# Patient Record
Sex: Female | Born: 1935 | Race: White | Hispanic: No | Smoking: Never smoker
Health system: Southern US, Community
[De-identification: ages and names within clinical notes are randomized; demographics above are authoritative.]

## PROBLEM LIST (undated history)

## (undated) DIAGNOSIS — G2581 Restless legs syndrome: Secondary | ICD-10-CM

## (undated) DIAGNOSIS — M797 Fibromyalgia: Secondary | ICD-10-CM

## (undated) DIAGNOSIS — G3184 Mild cognitive impairment, so stated: Secondary | ICD-10-CM

## (undated) DIAGNOSIS — E611 Iron deficiency: Secondary | ICD-10-CM

## (undated) DIAGNOSIS — I499 Cardiac arrhythmia, unspecified: Secondary | ICD-10-CM

## (undated) DIAGNOSIS — I10 Essential (primary) hypertension: Secondary | ICD-10-CM

## (undated) DIAGNOSIS — G459 Transient cerebral ischemic attack, unspecified: Secondary | ICD-10-CM

## (undated) DIAGNOSIS — G4700 Insomnia, unspecified: Secondary | ICD-10-CM

## (undated) DIAGNOSIS — H409 Unspecified glaucoma: Secondary | ICD-10-CM

## (undated) DIAGNOSIS — M5412 Radiculopathy, cervical region: Secondary | ICD-10-CM

## (undated) DIAGNOSIS — K219 Gastro-esophageal reflux disease without esophagitis: Secondary | ICD-10-CM

## (undated) DIAGNOSIS — M1990 Unspecified osteoarthritis, unspecified site: Secondary | ICD-10-CM

## (undated) DIAGNOSIS — K5900 Constipation, unspecified: Secondary | ICD-10-CM

## (undated) DIAGNOSIS — R2681 Unsteadiness on feet: Secondary | ICD-10-CM

## (undated) DIAGNOSIS — K589 Irritable bowel syndrome without diarrhea: Secondary | ICD-10-CM

## (undated) DIAGNOSIS — E785 Hyperlipidemia, unspecified: Secondary | ICD-10-CM

## (undated) DIAGNOSIS — E119 Type 2 diabetes mellitus without complications: Secondary | ICD-10-CM

## (undated) HISTORY — DX: Mild cognitive impairment of uncertain or unknown etiology: G31.84

## (undated) HISTORY — DX: Type 2 diabetes mellitus without complications: E11.9

## (undated) HISTORY — DX: Unspecified osteoarthritis, unspecified site: M19.90

## (undated) HISTORY — DX: Unsteadiness on feet: R26.81

## (undated) HISTORY — DX: Radiculopathy, cervical region: M54.12

## (undated) HISTORY — DX: Essential (primary) hypertension: I10

## (undated) HISTORY — DX: Fibromyalgia: M79.7

## (undated) HISTORY — DX: Gastro-esophageal reflux disease without esophagitis: K21.9

## (undated) HISTORY — PX: ABDOMINAL HYSTERECTOMY: SHX81

## (undated) HISTORY — DX: Hyperlipidemia, unspecified: E78.5

## (undated) HISTORY — DX: Restless legs syndrome: G25.81

## (undated) HISTORY — DX: Unspecified glaucoma: H40.9

## (undated) HISTORY — DX: Transient cerebral ischemic attack, unspecified: G45.9

## (undated) HISTORY — DX: Cardiac arrhythmia, unspecified: I49.9

## (undated) HISTORY — PX: TONSILLECTOMY: SUR1361

## (undated) HISTORY — DX: Iron deficiency: E61.1

## (undated) HISTORY — DX: Irritable bowel syndrome, unspecified: K58.9

## (undated) HISTORY — PX: SKIN CANCER EXCISION: SHX779

## (undated) HISTORY — DX: Insomnia, unspecified: G47.00

## (undated) HISTORY — DX: Constipation, unspecified: K59.00

---

## 2019-05-03 ENCOUNTER — Ambulatory Visit (INDEPENDENT_AMBULATORY_CARE_PROVIDER_SITE_OTHER): Payer: Medicare Other | Admitting: Cardiology

## 2019-05-03 ENCOUNTER — Other Ambulatory Visit: Payer: Self-pay

## 2019-05-03 ENCOUNTER — Encounter: Payer: Self-pay | Admitting: Cardiology

## 2019-05-03 VITALS — BP 147/76 | HR 88 | Ht 65.0 in | Wt 175.0 lb

## 2019-05-03 DIAGNOSIS — R0600 Dyspnea, unspecified: Secondary | ICD-10-CM | POA: Diagnosis not present

## 2019-05-03 DIAGNOSIS — R600 Localized edema: Secondary | ICD-10-CM | POA: Diagnosis not present

## 2019-05-03 DIAGNOSIS — R079 Chest pain, unspecified: Secondary | ICD-10-CM

## 2019-05-03 DIAGNOSIS — R0609 Other forms of dyspnea: Secondary | ICD-10-CM

## 2019-05-03 DIAGNOSIS — R42 Dizziness and giddiness: Secondary | ICD-10-CM

## 2019-05-03 MED ORDER — FUROSEMIDE 20 MG PO TABS: 10.0000 mg | ORAL_TABLET | Freq: Every day | ORAL | 3 refills | Status: DC

## 2019-05-03 NOTE — Progress Notes (Signed)
Cardiology Office Note:    Date:  05/03/2019   ID:  Leah Contreras, DOB Aug 25, 1931, MRN [id number]  PCP:  [name], FNP  Cardiologist:  Kate Sable, MD  Electrophysiologist:  None   Referring MD: [name], FNP   Chief Complaint  Patient presents with  . New Patient (Initial Visit)    Dizziness when standing, BL ankle edema, chest tightness, and DOE; Meds verbally reviewed with patient.    History of Present Illness:    Leah Contreras is a 86 y.o. female with a hx of hypertension, hyperlipidemia venting with shortness of breath and chest tightness.  Patient states having symptoms of shortness of breath for the past 6 months.  Symptoms have stayed about the same.  Symptoms usually occur with exertion.  She has a recent fall leading to right humerus fracture.  She tripped over a sidewalk causing the fall.  Currently in rehab and has noticed shortness of breath when she does her rehab.  She denies any history of heart disease.  Shortness of breath is sometimes associated with chest tightness which she rates as a 3 out of 10.  Chest discomfort lasts couple of minutes and goes away.  Symptoms do not occur at rest.  Over the past couple of months since starting rehab, she has noticed lower extremity edema which is sometimes worse towards the end of the day.  She denies palpitations, orthopnea, syncope.  She states having occasional lightheadedness when changing positions abruptly such as from sitting to standing, or bending to tie her shoelace.  Past Medical History:  Diagnosis Date  . Cervical radiculopathy   . Constipation   . Fibromyalgia   . Gait instability   . GERD (gastroesophageal reflux disease)   . Glaucoma   . Glaucoma   . Hyperlipidemia   . Hypertension   . IBS (irritable bowel syndrome)   . Insomnia   . Iron deficiency   . Irregular heart rhythm   . Mild cognitive impairment   . Osteoarthritis   . RLS (restless legs syndrome)   . TIA (transient ischemic  attack)   . Type 2 diabetes mellitus ([place])     Past Surgical History:  Procedure Laterality Date  . ABDOMINAL HYSTERECTOMY    . SKIN CANCER EXCISION    . TONSILLECTOMY      Current Medications: Current Meds  Medication Sig  . aspirin (ASPIRIN 81) 81 MG EC tablet Take 81 mg by mouth daily. Swallow whole.  . cetirizine (ZYRTEC) 10 MG tablet Take 10 mg by mouth daily.  . cyanocobalamin 2000 MCG tablet Take 2,000 mcg by mouth daily.  . Diclofenac Sodium 3 % GEL Apply topically as needed.   . docusate sodium (COLACE) 100 MG capsule Take 100 mg by mouth 2 (two) times daily.  . ferrous sulfate 325 (65 FE) MG tablet Take 325 mg by mouth daily with breakfast.  . lidocaine (LIDODERM) 5 % Place 1 patch onto the skin daily. Remove & Discard patch within 12 hours or as directed by MD  . lisinopril (ZESTRIL) 20 MG tablet Take 20 mg by mouth daily.  . metoprolol succinate (TOPROL-XL) 25 MG 24 hr tablet Take 25 mg by mouth daily.  . mirtazapine (REMERON) 15 MG tablet Take 15 mg by mouth at bedtime.  Marland Kitchen omeprazole (PRILOSEC) 20 MG capsule Take 20 mg by mouth daily.  . pregabalin (LYRICA) 75 MG capsule Take 75 mg by mouth 2 (two) times daily.  . rosuvastatin (CRESTOR) 10 MG tablet Take 10  mg by mouth daily.  . timolol (TIMOPTIC) 0.5 % ophthalmic solution 1 drop 2 (two) times daily.     Allergies:   Codeine sulfate [codeine], Erythromycin, and Tramadol   Social History   Socioeconomic History  . Marital status: Widowed    Spouse name: Not on file  . Number of children: Not on file  . Years of education: Not on file  . Highest education level: Not on file  Occupational History  . Not on file  Tobacco Use  . Smoking status: Never Smoker  . Smokeless tobacco: Never Used  Substance and Sexual Activity  . Alcohol use: Yes    Comment: rarely  . Drug use: Never  . Sexual activity: Not on file  Other Topics Concern  . Not on file  Social History Narrative  . Not on file   Social  Determinants of Health   Financial Resource Strain:   . Difficulty of Paying Living Expenses:   Food Insecurity:   . Worried About Programme researcher, broadcasting/film/video in the Last Year:   . Barista in the Last Year:   Transportation Needs:   . Freight forwarder (Medical):   Marland Kitchen Lack of Transportation (Non-Medical):   Physical Activity:   . Days of Exercise per Week:   . Minutes of Exercise per Session:   Stress:   . Feeling of Stress :   Social Connections:   . Frequency of Communication with Friends and Family:   . Frequency of Social Gatherings with Friends and Family:   . Attends Religious Services:   . Active Member of Clubs or Organizations:   . Attends Banker Meetings:   Marland Kitchen Marital Status:      Family History: The patient's family history includes Diabetes type II in her mother; Heart disease in her father and mother; Neurologic Disorder in her mother.  ROS:   Please see the history of present illness.     All other systems reviewed and are negative.  EKGs/Labs/Other Studies Reviewed:    The following studies were reviewed today:   EKG:  EKG is  ordered today.  The ekg ordered today demonstrates normal sinus rhythm, possible old inferior infarct.  Recent Labs: No results found for requested labs within last 8760 hours.  Recent Lipid Panel No results found for: CHOL, TRIG, HDL, CHOLHDL, VLDL, LDLCALC, LDLDIRECT  Physical Exam:    VS:  BP (!) 147/76 (BP Location: Right Arm, Patient Position: Sitting, Cuff Size: Normal)   Pulse 88   Ht 5\' 5"  (1.651 m)   Wt 175 lb (79.4 kg)   SpO2 92%   BMI 29.12 kg/m     Wt Readings from Last 3 Encounters:  05/03/19 175 lb (79.4 kg)     GEN:  Well nourished, well developed in no acute distress HEENT: Normal NECK: No JVD; No carotid bruits LYMPHATICS: No lymphadenopathy CARDIAC: RRR, no murmurs, rubs, gallops RESPIRATORY:  Clear to auscultation without rales, wheezing or rhonchi  ABDOMEN: Soft, non-tender,  non-distended MUSCULOSKELETAL:  1+ edema; No deformity  SKIN: Warm and dry NEUROLOGIC:  Alert and oriented x 3 PSYCHIATRIC:  Normal affect   ASSESSMENT:    1. Chest pain of uncertain etiology   2. Dyspnea on exertion   3. Edema of lower extremity   4. Dizziness    PLAN:    In order of problems listed above:  1. Patient with occasional exertional chest tightness.  She has risk factors of hypertension, hyperlipidemia.  Will evaluate for the presence of CAD with Lexiscan Myoview. 2. Patient has dyspnea on exertion and lower extremity edema.  Will get echocardiogram to evaluate any structural dysfunction/cardiomyopathy. 3. Lower extremity edema worse towards the end of the day.  This could be venous insufficiency or cardiomyopathy.  Start Lasix 20 mg daily.  Will evaluate with echocardiogram as above. 4. Patient with positional dizziness.  Orthostatic vitals in the office today did not reveal any evidence for orthostasis.  She likely has benign positional vertigo.  Recommend follow-up with PCP for further management.  Follow-up after echocardiogram, Myoview.  Total encounter time more than 65 minutes  Greater than 50% was spent in counseling and coordination of care with the patient. Time spent explaining to patient and daughter etiologies of dizziness, etiologies of edema, indications for stress testing.  All questions were answered.   This note was generated in part or whole with voice recognition software. Voice recognition is usually quite accurate but there are transcription errors that can and very often do occur. I apologize for any typographical errors that were not detected and corrected.  Medication Adjustments/Labs and Tests Ordered: Current medicines are reviewed at length with the patient today.  Concerns regarding medicines are outlined above.  No orders of the defined types were placed in this encounter.  No orders of the defined types were placed in this  encounter.   There are no Patient Instructions on file for this visit.   Signed, Debbe Odea, MD  05/03/2019 3:38 PM    [facility]

## 2019-05-03 NOTE — Patient Instructions (Addendum)
Medication Instructions:  - Your physician has recommended you make the following change in your medication:   1) Start lasix (furosemide) 20 mg- take 0.5 tablet (10 mg) by mouth once daily  *If you need a refill on your cardiac medications before your next appointment, please call your pharmacy*   Lab Work: - none ordered  If you have labs (blood work) drawn today and your tests are completely normal, you will receive your results only by: Marland Kitchen MyChart Message (if you have MyChart) OR . A paper copy in the mail If you have any lab test that is abnormal or we need to change your treatment, we will call you to review the results.   Testing/Procedures: - Your physician has requested that you have an echocardiogram. Echocardiography is a painless test that uses sound waves to create images of your heart. It provides your doctor with information about the size and shape of your heart and how well your heart's chambers and valves are working. This procedure takes approximately one hour. There are no restrictions for this procedure.  - Your physician has requested that you have a lexiscan myoview.    [facility] MYOVIEW  Your caregiver has ordered a Stress Test with nuclear imaging. The purpose of this test is to evaluate the blood supply to your heart muscle. This procedure is referred to as a "Non-Invasive Stress Test." This is because other than having an IV started in your vein, nothing is inserted or "invades" your body. Cardiac stress tests are done to find areas of poor blood flow to the heart by determining the extent of coronary artery disease (CAD). Some patients exercise on a treadmill, which naturally increases the blood flow to your heart, while others who are  unable to walk on a treadmill due to physical limitations have a pharmacologic/chemical stress agent called Lexiscan . This medicine will mimic walking on a treadmill by temporarily increasing your coronary blood flow.   Please note:  these test may take anywhere between 2-4 hours to complete  PLEASE REPORT TO [facility] MEDICAL MALL ENTRANCE  THE VOLUNTEERS AT THE FIRST DESK WILL DIRECT YOU WHERE TO GO  Date of Procedure:_____________________________________  Arrival Time for Procedure:______________________________  Instructions regarding medication:   _x___:  Hold betablocker(s) night before procedure and morning of procedure (metoprolol)   __x__:  Hold lasix (furosemide) the morning of your test  PLEASE NOTIFY THE OFFICE AT LEAST 24 HOURS IN ADVANCE IF YOU ARE UNABLE TO KEEP YOUR APPOINTMENT.  [phone] AND  PLEASE NOTIFY NUCLEAR MEDICINE AT [facility] AT LEAST 24 HOURS IN ADVANCE IF YOU ARE UNABLE TO KEEP YOUR APPOINTMENT. [phone]  How to prepare for your Myoview test:  1. Do not eat or drink after midnight 2. No caffeine for 24 hours prior to test 3. No smoking 24 hours prior to test. 4. Any medications not listed above may be taken with water the morning of your test 5. Ladies, please do not wear dresses.  Skirts or pants are appropriate. Please wear a short sleeve shirt. 6. No perfume, cologne or lotion. 7. Wear comfortable walking shoes. No heels!     Follow-Up: At [facility], you and your health needs are our priority.  As part of our continuing mission to provide you with exceptional heart care, we have created designated Provider Care Teams.  These Care Teams include your primary Cardiologist (physician) and Advanced Practice Providers (APPs -  Physician Assistants and Nurse Practitioners) who all work together to provide you with  the care you need, when you need it.  We recommend signing up for the patient portal called "MyChart".  Sign up information is provided on this After Visit Summary.  MyChart is used to connect with patients for Virtual Visits (Telemedicine).  Patients are able to view lab/test results, encounter notes, upcoming appointments, etc.  Non-urgent messages can be sent to your  provider as well.   To learn more about what you can do with MyChart, go to [email or web address].    Your next appointment:   After testing is completed   The format for your next appointment:   In Person  Provider:   Debbe Odea, MD   Other Instructions n/a   Echocardiogram An echocardiogram is a procedure that uses painless sound waves (ultrasound) to produce an image of the heart. Images from an echocardiogram can provide important information about:  Signs of coronary artery disease (CAD).  Aneurysm detection. An aneurysm is a weak or damaged part of an artery wall that bulges out from the normal force of blood pumping through the body.  Heart size and shape. Changes in the size or shape of the heart can be associated with certain conditions, including heart failure, aneurysm, and CAD.  Heart muscle function.  Heart valve function.  Signs of a past heart attack.  Fluid buildup around the heart.  Thickening of the heart muscle.  A tumor or infectious growth around the heart valves. Tell a health care provider about:  Any allergies you have.  All medicines you are taking, including vitamins, herbs, eye drops, creams, and over-the-counter medicines.  Any blood disorders you have.  Any surgeries you have had.  Any medical conditions you have.  Whether you are pregnant or may be pregnant. What are the risks? Generally, this is a safe procedure. However, problems may occur, including:  Allergic reaction to dye (contrast) that may be used during the procedure. What happens before the procedure? No specific preparation is needed. You may eat and drink normally. What happens during the procedure?   An IV tube may be inserted into one of your veins.  You may receive contrast through this tube. A contrast is an injection that improves the quality of the pictures from your heart.  A gel will be applied to your chest.  A wand-like tool (transducer)  will be moved over your chest. The gel will help to transmit the sound waves from the transducer.  The sound waves will harmlessly bounce off of your heart to allow the heart images to be captured in real-time motion. The images will be recorded on a computer. The procedure may vary among health care providers and hospitals. What happens after the procedure?  You may return to your normal, everyday life, including diet, activities, and medicines, unless your health care provider tells you not to do that. Summary  An echocardiogram is a procedure that uses painless sound waves (ultrasound) to produce an image of the heart.  Images from an echocardiogram can provide important information about the size and shape of your heart, heart muscle function, heart valve function, and fluid buildup around your heart.  You do not need to do anything to prepare before this procedure. You may eat and drink normally.  After the echocardiogram is completed, you may return to your normal, everyday life, unless your health care provider tells you not to do that. This information is not intended to replace advice given to you by your health care provider. Make sure you discuss  any questions you have with your health care provider. Document Revised: 04/12/2018 Document Reviewed: 01/23/2016 Elsevier Patient Education  2020 Elsevier Inc.    Cardiac Nuclear Scan A cardiac nuclear scan is a test that measures blood flow to the heart when a person is resting and when he or she is exercising. The test looks for problems such as:  Not enough blood reaching a portion of the heart.  The heart muscle not working normally. You may need this test if:  You have heart disease.  You have had abnormal lab results.  You have had heart surgery or a balloon procedure to open up blocked arteries (angioplasty).  You have chest pain.  You have shortness of breath. In this test, a radioactive dye (tracer) is injected  into your bloodstream. After the tracer has traveled to your heart, an imaging device is used to measure how much of the tracer is absorbed by or distributed to various areas of your heart. This procedure is usually done at a hospital and takes 2-4 hours. Tell a health care provider about:  Any allergies you have.  All medicines you are taking, including vitamins, herbs, eye drops, creams, and over-the-counter medicines.  Any problems you or family members have had with anesthetic medicines.  Any blood disorders you have.  Any surgeries you have had.  Any medical conditions you have.  Whether you are pregnant or may be pregnant. What are the risks? Generally, this is a safe procedure. However, problems may occur, including:  Serious chest pain and heart attack. This is only a risk if the stress portion of the test is done.  Rapid heartbeat.  Sensation of warmth in your chest. This usually passes quickly.  Allergic reaction to the tracer. What happens before the procedure?  Ask your health care provider about changing or stopping your regular medicines. This is especially important if you are taking diabetes medicines or blood thinners.  Follow instructions from your health care provider about eating or drinking restrictions.  Remove your jewelry on the day of the procedure. What happens during the procedure?  An IV will be inserted into one of your veins.  Your health care provider will inject a small amount of radioactive tracer through the IV.  You will wait for 20-40 minutes while the tracer travels through your bloodstream.  Your heart activity will be monitored with an electrocardiogram (ECG).  You will lie down on an exam table.  Images of your heart will be taken for about 15-20 minutes.  You may also have a stress test. For this test, one of the following may be done: ? You will exercise on a treadmill or stationary bike. While you exercise, your heart's  activity will be monitored with an ECG, and your blood pressure will be checked. ? You will be given medicines that will increase blood flow to parts of your heart. This is done if you are unable to exercise.  When blood flow to your heart has peaked, a tracer will again be injected through the IV.  After 20-40 minutes, you will get back on the exam table and have more images taken of your heart.  Depending on the type of tracer used, scans may need to be repeated 3-4 hours later.  Your IV line will be removed when the procedure is over. The procedure may vary among health care providers and hospitals. What happens after the procedure?  Unless your health care provider tells you otherwise, you may return to  your normal schedule, including diet, activities, and medicines.  Unless your health care provider tells you otherwise, you may increase your fluid intake. This will help to flush the contrast dye from your body. Drink enough fluid to keep your urine pale yellow.  Ask your health care provider, or the department that is doing the test: ? When will my results be ready? ? How will I get my results? Summary  A cardiac nuclear scan measures the blood flow to the heart when a person is resting and when he or she is exercising.  Tell your health care provider if you are pregnant.  Before the procedure, ask your health care provider about changing or stopping your regular medicines. This is especially important if you are taking diabetes medicines or blood thinners.  After the procedure, unless your health care provider tells you otherwise, increase your fluid intake. This will help flush the contrast dye from your body.  After the procedure, unless your health care provider tells you otherwise, you may return to your normal schedule, including diet, activities, and medicines. This information is not intended to replace advice given to you by your health care provider. Make sure you  discuss any questions you have with your health care provider. Document Revised: 06/05/2017 Document Reviewed: 06/05/2017 Elsevier Patient Education  [place].

## 2019-05-22 ENCOUNTER — Other Ambulatory Visit: Payer: Self-pay

## 2019-05-22 ENCOUNTER — Ambulatory Visit
Admission: RE | Admit: 2019-05-22 | Discharge: 2019-05-22 | Disposition: A | Discharge status: Home / Self Care | Payer: Medicare Other | Source: Ambulatory Visit | Attending: Cardiology | Admitting: Cardiology

## 2019-05-22 DIAGNOSIS — R079 Chest pain, unspecified: Secondary | ICD-10-CM | POA: Diagnosis present

## 2019-05-22 LAB — NM MYOCAR MULTI W/SPECT W/WALL MOTION / EF
Estimated workload: 1 METS
Exercise duration (min): 0 min
Exercise duration (sec): 0 s
LV dias vol: 54 mL (ref 46–106)
LV sys vol: 16 mL
MPHR: 134 {beats}/min
Peak HR: 93 {beats}/min
Percent HR: 69 %
Rest HR: 74 {beats}/min
SDS: 2
SRS: 5
SSS: 4
TID: 0.97

## 2019-05-22 MED ORDER — REGADENOSON 0.4 MG/5ML IV SOLN
0.4000 mg | Freq: Once | INTRAVENOUS | Status: AC
  Administered 2019-05-22: 0.4 mg via INTRAVENOUS
  Filled 2019-05-22: qty 5

## 2019-05-22 MED ORDER — TECHNETIUM TC 99M TETROFOSMIN IV KIT
31.5300 | PACK | Freq: Once | INTRAVENOUS | Status: AC | PRN
  Administered 2019-05-22: 31.53 via INTRAVENOUS

## 2019-05-22 MED ORDER — TECHNETIUM TC 99M TETROFOSMIN IV KIT
10.0000 | PACK | Freq: Once | INTRAVENOUS | Status: AC | PRN
  Administered 2019-05-22: 10.9 via INTRAVENOUS

## 2019-05-23 ENCOUNTER — Telehealth: Payer: Self-pay

## 2019-05-23 NOTE — Telephone Encounter (Signed)
Called to give the patient stress test results and Dr. Merita Norton recommendation. Unable to lmom. Patients phone rings out and then gives a busy signal.

## 2019-05-23 NOTE — Telephone Encounter (Signed)
-----   Message from Brian Agbor-Etang, MD sent at 05/22/2019  4:39 PM EDT ----- Low risk scan, very small defect in the anterior wall.  LV wall motion is normal.  Keep follow-up appointment. 

## 2019-05-28 ENCOUNTER — Encounter: Payer: Self-pay | Admitting: *Deleted

## 2019-05-28 ENCOUNTER — Other Ambulatory Visit: Payer: Self-pay

## 2019-05-28 ENCOUNTER — Inpatient Hospital Stay
Admission: EM | Admit: 2019-05-28 | Discharge: 2019-06-02 | DRG: 184 | Disposition: A | Discharge status: Skilled Nursing Facility | Payer: Medicare Other | Attending: Internal Medicine | Admitting: Internal Medicine

## 2019-05-28 ENCOUNTER — Emergency Department: Payer: Medicare Other

## 2019-05-28 DIAGNOSIS — S42109A Fracture of unspecified part of scapula, unspecified shoulder, initial encounter for closed fracture: Secondary | ICD-10-CM | POA: Diagnosis present

## 2019-05-28 DIAGNOSIS — S2242XA Multiple fractures of ribs, left side, initial encounter for closed fracture: Secondary | ICD-10-CM | POA: Diagnosis not present

## 2019-05-28 DIAGNOSIS — E785 Hyperlipidemia, unspecified: Secondary | ICD-10-CM | POA: Diagnosis present

## 2019-05-28 DIAGNOSIS — S32010A Wedge compression fracture of first lumbar vertebra, initial encounter for closed fracture: Secondary | ICD-10-CM | POA: Diagnosis present

## 2019-05-28 DIAGNOSIS — S42192A Fracture of other part of scapula, left shoulder, initial encounter for closed fracture: Secondary | ICD-10-CM

## 2019-05-28 DIAGNOSIS — I10 Essential (primary) hypertension: Secondary | ICD-10-CM | POA: Diagnosis not present

## 2019-05-28 DIAGNOSIS — S2242XD Multiple fractures of ribs, left side, subsequent encounter for fracture with routine healing: Secondary | ICD-10-CM

## 2019-05-28 DIAGNOSIS — Z90710 Acquired absence of both cervix and uterus: Secondary | ICD-10-CM

## 2019-05-28 DIAGNOSIS — T07XXXA Unspecified multiple injuries, initial encounter: Secondary | ICD-10-CM | POA: Diagnosis present

## 2019-05-28 DIAGNOSIS — G2581 Restless legs syndrome: Secondary | ICD-10-CM | POA: Diagnosis present

## 2019-05-28 DIAGNOSIS — D649 Anemia, unspecified: Secondary | ICD-10-CM | POA: Diagnosis present

## 2019-05-28 DIAGNOSIS — R339 Retention of urine, unspecified: Secondary | ICD-10-CM | POA: Diagnosis present

## 2019-05-28 DIAGNOSIS — D72829 Elevated white blood cell count, unspecified: Secondary | ICD-10-CM | POA: Diagnosis present

## 2019-05-28 DIAGNOSIS — Z85828 Personal history of other malignant neoplasm of skin: Secondary | ICD-10-CM

## 2019-05-28 DIAGNOSIS — W19XXXA Unspecified fall, initial encounter: Secondary | ICD-10-CM | POA: Diagnosis not present

## 2019-05-28 DIAGNOSIS — G3184 Mild cognitive impairment, so stated: Secondary | ICD-10-CM | POA: Diagnosis present

## 2019-05-28 DIAGNOSIS — H409 Unspecified glaucoma: Secondary | ICD-10-CM | POA: Diagnosis present

## 2019-05-28 DIAGNOSIS — G4700 Insomnia, unspecified: Secondary | ICD-10-CM | POA: Diagnosis present

## 2019-05-28 DIAGNOSIS — R338 Other retention of urine: Secondary | ICD-10-CM | POA: Diagnosis not present

## 2019-05-28 DIAGNOSIS — Z8673 Personal history of transient ischemic attack (TIA), and cerebral infarction without residual deficits: Secondary | ICD-10-CM

## 2019-05-28 DIAGNOSIS — S32019A Unspecified fracture of first lumbar vertebra, initial encounter for closed fracture: Secondary | ICD-10-CM | POA: Diagnosis present

## 2019-05-28 DIAGNOSIS — Z7982 Long term (current) use of aspirin: Secondary | ICD-10-CM

## 2019-05-28 DIAGNOSIS — Z79899 Other long term (current) drug therapy: Secondary | ICD-10-CM

## 2019-05-28 DIAGNOSIS — Z885 Allergy status to narcotic agent status: Secondary | ICD-10-CM

## 2019-05-28 DIAGNOSIS — Z881 Allergy status to other antibiotic agents status: Secondary | ICD-10-CM

## 2019-05-28 DIAGNOSIS — S42112A Displaced fracture of body of scapula, left shoulder, initial encounter for closed fracture: Secondary | ICD-10-CM | POA: Diagnosis present

## 2019-05-28 DIAGNOSIS — M797 Fibromyalgia: Secondary | ICD-10-CM | POA: Diagnosis present

## 2019-05-28 DIAGNOSIS — M79602 Pain in left arm: Secondary | ICD-10-CM | POA: Diagnosis not present

## 2019-05-28 DIAGNOSIS — K219 Gastro-esophageal reflux disease without esophagitis: Secondary | ICD-10-CM | POA: Diagnosis present

## 2019-05-28 DIAGNOSIS — I951 Orthostatic hypotension: Secondary | ICD-10-CM | POA: Diagnosis not present

## 2019-05-28 DIAGNOSIS — Z8249 Family history of ischemic heart disease and other diseases of the circulatory system: Secondary | ICD-10-CM

## 2019-05-28 DIAGNOSIS — Z20822 Contact with and (suspected) exposure to covid-19: Secondary | ICD-10-CM | POA: Diagnosis present

## 2019-05-28 DIAGNOSIS — Z833 Family history of diabetes mellitus: Secondary | ICD-10-CM

## 2019-05-28 DIAGNOSIS — S2241XD Multiple fractures of ribs, right side, subsequent encounter for fracture with routine healing: Secondary | ICD-10-CM

## 2019-05-28 DIAGNOSIS — K589 Irritable bowel syndrome without diarrhea: Secondary | ICD-10-CM | POA: Diagnosis present

## 2019-05-28 DIAGNOSIS — S0003XA Contusion of scalp, initial encounter: Secondary | ICD-10-CM | POA: Diagnosis present

## 2019-05-28 DIAGNOSIS — E119 Type 2 diabetes mellitus without complications: Secondary | ICD-10-CM | POA: Diagnosis present

## 2019-05-28 DIAGNOSIS — W010XXA Fall on same level from slipping, tripping and stumbling without subsequent striking against object, initial encounter: Secondary | ICD-10-CM | POA: Diagnosis present

## 2019-05-28 DIAGNOSIS — E611 Iron deficiency: Secondary | ICD-10-CM | POA: Diagnosis present

## 2019-05-28 DIAGNOSIS — Y92811 Bus as the place of occurrence of the external cause: Secondary | ICD-10-CM

## 2019-05-28 DIAGNOSIS — K5900 Constipation, unspecified: Secondary | ICD-10-CM | POA: Diagnosis present

## 2019-05-28 LAB — URINALYSIS, ROUTINE W REFLEX MICROSCOPIC
Bilirubin Urine: NEGATIVE
Glucose, UA: NEGATIVE mg/dL
Hgb urine dipstick: NEGATIVE
Ketones, ur: NEGATIVE mg/dL
Leukocytes,Ua: NEGATIVE
Nitrite: NEGATIVE
Protein, ur: NEGATIVE mg/dL
Specific Gravity, Urine: 1.01 (ref 1.005–1.030)
pH: 7 (ref 5.0–8.0)

## 2019-05-28 LAB — BASIC METABOLIC PANEL
Anion gap: 10 (ref 5–15)
BUN: 20 mg/dL (ref 8–23)
CO2: 22 mmol/L (ref 22–32)
Calcium: 10 mg/dL (ref 8.9–10.3)
Chloride: 108 mmol/L (ref 98–111)
Creatinine, Ser: 1.05 mg/dL — ABNORMAL HIGH (ref 0.44–1.00)
GFR calc Af Amer: 56 mL/min — ABNORMAL LOW (ref >60)
GFR calc non Af Amer: 48 mL/min — ABNORMAL LOW (ref >60)
Glucose, Bld: 121 mg/dL — ABNORMAL HIGH (ref 70–99)
Potassium: 4.4 mmol/L (ref 3.5–5.1)
Sodium: 140 mmol/L (ref 135–145)

## 2019-05-28 LAB — CBC
HCT: 35.3 % — ABNORMAL LOW (ref 36.0–46.0)
Hemoglobin: 11.6 g/dL — ABNORMAL LOW (ref 12.0–15.0)
MCH: 29.4 pg (ref 26.0–34.0)
MCHC: 32.9 g/dL (ref 30.0–36.0)
MCV: 89.4 fL (ref 80.0–100.0)
Platelets: 127 10*3/uL — ABNORMAL LOW (ref 150–400)
RBC: 3.95 MIL/uL (ref 3.87–5.11)
RDW: 14.6 % (ref 11.5–15.5)
WBC: 12.8 10*3/uL — ABNORMAL HIGH (ref 4.0–10.5)
nRBC: 0 % (ref 0.0–0.2)

## 2019-05-28 LAB — SARS CORONAVIRUS 2 BY RT PCR (HOSPITAL ORDER, PERFORMED IN ~~LOC~~ HOSPITAL LAB): SARS Coronavirus 2: NEGATIVE

## 2019-05-28 MED ORDER — ACETAMINOPHEN 325 MG PO TABS: 650.0000 mg | ORAL_TABLET | Freq: Four times a day (QID) | ORAL | Status: DC | PRN

## 2019-05-28 MED ORDER — ONDANSETRON HCL 4 MG/2ML IJ SOLN
4.0000 mg | Freq: Once | INTRAMUSCULAR | Status: AC
  Administered 2019-05-28: 4 mg via INTRAVENOUS
  Filled 2019-05-28: qty 2

## 2019-05-28 MED ORDER — ROSUVASTATIN CALCIUM 10 MG PO TABS
10.0000 mg | ORAL_TABLET | Freq: Every day | ORAL | Status: DC
  Administered 2019-05-29 – 2019-06-01 (×4): 10 mg via ORAL
  Filled 2019-05-28 (×5): qty 1

## 2019-05-28 MED ORDER — OXYCODONE-ACETAMINOPHEN 5-325 MG PO TABS
1.0000 | ORAL_TABLET | Freq: Four times a day (QID) | ORAL | Status: DC | PRN
  Administered 2019-05-28 – 2019-05-30 (×2): 1 via ORAL
  Administered 2019-05-31: 2 via ORAL
  Administered 2019-06-02: 1 via ORAL
  Filled 2019-05-28: qty 2
  Filled 2019-05-28 (×3): qty 1

## 2019-05-28 MED ORDER — MIRTAZAPINE 15 MG PO TABS
15.0000 mg | ORAL_TABLET | Freq: Every day | ORAL | Status: DC
  Administered 2019-05-29 – 2019-06-01 (×4): 15 mg via ORAL
  Filled 2019-05-28 (×4): qty 1

## 2019-05-28 MED ORDER — FUROSEMIDE 20 MG PO TABS
10.0000 mg | ORAL_TABLET | Freq: Every day | ORAL | Status: DC
  Administered 2019-05-29 – 2019-05-30 (×2): 10 mg via ORAL
  Filled 2019-05-28: qty 1
  Filled 2019-05-28 (×2): qty 0.5

## 2019-05-28 MED ORDER — FENTANYL CITRATE (PF) 100 MCG/2ML IJ SOLN
12.5000 ug | INTRAMUSCULAR | Status: DC | PRN
  Administered 2019-05-29: 12.5 ug via INTRAVENOUS
  Filled 2019-05-28: qty 2

## 2019-05-28 MED ORDER — METOPROLOL SUCCINATE ER 25 MG PO TB24
25.0000 mg | ORAL_TABLET | Freq: Every day | ORAL | Status: DC
  Administered 2019-05-29 – 2019-06-01 (×4): 25 mg via ORAL
  Filled 2019-05-28 (×4): qty 1

## 2019-05-28 MED ORDER — LORATADINE 10 MG PO TABS
10.0000 mg | ORAL_TABLET | Freq: Every day | ORAL | Status: DC
  Administered 2019-05-29 – 2019-06-01 (×4): 10 mg via ORAL
  Filled 2019-05-28 (×4): qty 1

## 2019-05-28 MED ORDER — LISINOPRIL 20 MG PO TABS
20.0000 mg | ORAL_TABLET | Freq: Every day | ORAL | Status: DC
  Administered 2019-05-29 – 2019-05-30 (×2): 20 mg via ORAL
  Filled 2019-05-28: qty 2
  Filled 2019-05-28: qty 1

## 2019-05-28 MED ORDER — DOCUSATE SODIUM 100 MG PO CAPS
100.0000 mg | ORAL_CAPSULE | Freq: Two times a day (BID) | ORAL | Status: DC
  Administered 2019-05-29 – 2019-06-01 (×8): 100 mg via ORAL
  Filled 2019-05-28 (×8): qty 1

## 2019-05-28 MED ORDER — ASPIRIN EC 81 MG PO TBEC
81.0000 mg | DELAYED_RELEASE_TABLET | Freq: Every day | ORAL | Status: DC
  Administered 2019-05-29 – 2019-06-01 (×4): 81 mg via ORAL
  Filled 2019-05-28 (×4): qty 1

## 2019-05-28 MED ORDER — VITAMIN B-12 1000 MCG PO TABS
2000.0000 ug | ORAL_TABLET | Freq: Every day | ORAL | Status: DC
  Administered 2019-05-29 – 2019-06-01 (×4): 2000 ug via ORAL
  Filled 2019-05-28 (×5): qty 2

## 2019-05-28 MED ORDER — SENNA 8.6 MG PO TABS: 2.0000 | ORAL_TABLET | Freq: Every evening | ORAL | Status: DC | PRN

## 2019-05-28 MED ORDER — METHOCARBAMOL 500 MG PO TABS
500.0000 mg | ORAL_TABLET | Freq: Every day | ORAL | Status: DC
  Administered 2019-05-29 – 2019-06-01 (×4): 500 mg via ORAL
  Filled 2019-05-28 (×5): qty 1

## 2019-05-28 MED ORDER — FENTANYL CITRATE (PF) 100 MCG/2ML IJ SOLN
50.0000 ug | Freq: Once | INTRAMUSCULAR | Status: AC
  Administered 2019-05-28: 50 ug via INTRAVENOUS
  Filled 2019-05-28: qty 2

## 2019-05-28 MED ORDER — PREGABALIN 75 MG PO CAPS
75.0000 mg | ORAL_CAPSULE | Freq: Two times a day (BID) | ORAL | Status: DC
  Administered 2019-05-29 – 2019-06-01 (×8): 75 mg via ORAL
  Filled 2019-05-28 (×8): qty 1

## 2019-05-28 MED ORDER — PANTOPRAZOLE SODIUM 40 MG PO TBEC
40.0000 mg | DELAYED_RELEASE_TABLET | Freq: Every day | ORAL | Status: DC
  Administered 2019-05-29 – 2019-06-01 (×4): 40 mg via ORAL
  Filled 2019-05-28 (×4): qty 1

## 2019-05-28 MED ORDER — FERROUS SULFATE 325 (65 FE) MG PO TABS
325.0000 mg | ORAL_TABLET | Freq: Every day | ORAL | Status: DC
  Administered 2019-05-29 – 2019-06-01 (×4): 325 mg via ORAL
  Filled 2019-05-28 (×5): qty 1

## 2019-05-28 NOTE — ED Notes (Signed)
See triage note    States she fell getting into the bus at [facility]  Having pain to back and left shoulder  Good pulses to left   Increase dapin with movement

## 2019-05-28 NOTE — ED Triage Notes (Signed)
Pt brought in via ems from [facility] with a fall.  Pt fell getting into the bus.  Pt fell backwards landing on the curb.  Pt has back pain and left shoulder pain.   Limited rom left arm. No loc  No vomiting.  Hematoma to left side of head.  Abrasion noted with bleeding. No blood thinners.  No etoh use.  Pt alert  Speech clear.

## 2019-05-28 NOTE — ED Provider Notes (Signed)
[facility] Emergency Department Provider Note  Time seen: 6:43 PM  I have reviewed the triage vital signs and the nursing notes.   HISTORY  Chief Complaint Fall   HPI Leah Contreras is a 86 y.o. female with a past medical history of fibromyalgia, gastric reflux, hypertension, hyperlipidemia, anemia, diabetes, presents to the emergency department after a fall.  According to the patient she was attempting to get off a bus when she fell backwards landing on the sidewalk.  Patient states she hit her left side on the cement and the left side of her head.  Patient denies LOC although states she felt like she could pass out due to the pain in her upper back on the left side.  Currently describes moderate pain becomes severe with any type of movement, dull aching type pain.  Also states some lower back pain as well.  Denies extremity pain besides left shoulder discomfort.   Past Medical History:  Diagnosis Date  . Cervical radiculopathy   . Constipation   . Fibromyalgia   . Gait instability   . GERD (gastroesophageal reflux disease)   . Glaucoma   . Glaucoma   . Hyperlipidemia   . Hypertension   . IBS (irritable bowel syndrome)   . Insomnia   . Iron deficiency   . Irregular heart rhythm   . Mild cognitive impairment   . Osteoarthritis   . RLS (restless legs syndrome)   . TIA (transient ischemic attack)   . Type 2 diabetes mellitus (HCC)     There are no problems to display for this patient.   Past Surgical History:  Procedure Laterality Date  . ABDOMINAL HYSTERECTOMY    . SKIN CANCER EXCISION    . TONSILLECTOMY      Prior to Admission medications   Medication Sig Start Date End Date Taking? Authorizing Provider  aspirin (ASPIRIN 81) 81 MG EC tablet Take 81 mg by mouth daily. Swallow whole.    [provider]  cetirizine (ZYRTEC) 10 MG tablet Take 10 mg by mouth daily.    [provider]  cyanocobalamin 2000 MCG tablet Take 2,000 mcg  by mouth daily.    [provider]  Diclofenac Sodium 3 % GEL Apply topically as needed.     [provider]  docusate sodium (COLACE) 100 MG capsule Take 100 mg by mouth 2 (two) times daily.    [provider]  ferrous sulfate 325 (65 FE) MG tablet Take 325 mg by mouth daily with breakfast.    [provider]  furosemide (LASIX) 20 MG tablet Take 0.5 tablets (10 mg total) by mouth daily. 05/03/19   Debbe Odea, MD  lidocaine (LIDODERM) 5 % Place 1 patch onto the skin daily. Remove & Discard patch within 12 hours or as directed by MD    [provider]  lisinopril (ZESTRIL) 20 MG tablet Take 20 mg by mouth daily.    [provider]  metoprolol succinate (TOPROL-XL) 25 MG 24 hr tablet Take 25 mg by mouth daily.    [provider]  mirtazapine (REMERON) 15 MG tablet Take 15 mg by mouth at bedtime.    [provider]  omeprazole (PRILOSEC) 20 MG capsule Take 20 mg by mouth daily.    [provider]  pregabalin (LYRICA) 75 MG capsule Take 75 mg by mouth 2 (two) times daily.    [provider]  rosuvastatin (CRESTOR) 10 MG tablet Take 10 mg by mouth daily.  [provider]  timolol (TIMOPTIC) 0.5 % ophthalmic solution 1 drop 2 (two) times daily.    [provider]    Allergies  Allergen Reactions  . Codeine Sulfate [Codeine]     Rash   . Erythromycin     Rash   . Tramadol     Family History  Problem Relation Age of Onset  . Heart disease Mother   . Diabetes type II Mother   . Neurologic Disorder Mother   . Heart disease Father     Social History Social History   Tobacco Use  . Smoking status: Never Smoker  . Smokeless tobacco: Never Used  Substance Use Topics  . Alcohol use: Not Currently    Comment: rarely  . Drug use: Never    Review of Systems Constitutional: Negative for loss of consciousness. Cardiovascular: Positive for left posterior chest  pain. Respiratory: Negative for shortness of breath. Gastrointestinal: Negative for abdominal pain Musculoskeletal: Left upper back pain, left shoulder pain, lower back pain. Skin: Negative for skin complaints  Neurological: Negative for headache All other ROS negative  ____________________________________________   PHYSICAL EXAM:  VITAL SIGNS: ED Triage Vitals  Enc Vitals Group     BP 05/28/19 1656 (!) 197/67     Pulse Rate 05/28/19 1656 84     Resp 05/28/19 1656 20     Temp 05/28/19 1656 99.3 F (37.4 C)     Temp Source 05/28/19 1656 Oral     SpO2 05/28/19 1656 98 %     Weight 05/28/19 1657 165 lb (74.8 kg)     Height 05/28/19 1657 5\' 5"  (1.651 m)     Head Circumference --      Peak Flow --      Pain Score 05/28/19 1702 10     Pain Loc --      Pain Edu? --      Excl. in GC? --     Constitutional: Alert and oriented. Well appearing and in no distress. Eyes: Normal exam ENT      Head: Tenderness/small hematoma to left occipital scalp      Mouth/Throat: Mucous membranes are moist. Cardiovascular: Normal rate, regular rhythm Respiratory: Normal respiratory effort without tachypnea nor retractions. Breath sounds are clear.  Moderate left-sided chest wall tenderness to palpation. Gastrointestinal: Soft and nontender. No distention Musculoskeletal: Significant pain with range of motion of left shoulder.  Moderate tenderness palpation of left chest and left posterior chest.  Mild L-spine pain. Neurologic:  Normal speech and language. No gross focal neurologic deficits  Skin:  Skin is warm, dry.  No lacerations. Psychiatric: Mood and affect are normal.     RADIOLOGY  X-rays are positive for left-sided rib fractures and a left scapula fracture.  We will obtain CT imaging to further evaluate. CT scan of the head and cervical spine are negative.  ____________________________________________   INITIAL IMPRESSION / ASSESSMENT AND PLAN / ED COURSE  Pertinent labs &  imaging results that were available during my care of the patient were reviewed by me and considered in my medical decision making (see chart for details).   Patient presents emergency department after a fall getting out of a bus falling onto her left side with head injury but no LOC.  No anticoagulation.  CT scans are negative for acute abnormality in the head or C-spine although patient does have a moderate left occipital hematoma.  Patient's majority of her pain is in the left shoulder area.  Appears to have  third and fourth rib fractures on the left side with a scapular fracture on the left side.  We will obtain CT imaging of the chest to further evaluate the rib fracture extent as well as extent of scapular fracture and to ensure no pneumothorax.  Patient agreeable to plan of care.  Patient is in considerable discomfort.  We will dose pain medication and check labs.  Patient agreeable.  Leah Contreras was evaluated in Emergency Department on 05/28/2019 for the symptoms described in the history of present illness. She was evaluated in the context of the global COVID-19 pandemic, which necessitated consideration that the patient might be at risk for infection with the SARS-CoV-2 virus that causes COVID-19. Institutional protocols and algorithms that pertain to the evaluation of patients at risk for COVID-19 are in a state of rapid change based on information released by regulatory bodies including the CDC and federal and state organizations. These policies and algorithms were followed during the patient's care in the ED.  ____________________________________________   FINAL CLINICAL IMPRESSION(S) / ED DIAGNOSES  Fall Multiple rib fractures Left scapula fracture   Harvest Dark, MD 06/03/19 1444

## 2019-05-28 NOTE — ED Notes (Signed)
Pt placed on 2LPM [place] due to spO2 of 92% following meds

## 2019-05-28 NOTE — H&P (Signed)
History and Physical    Leah Contreras UVO:[id number] DOB: 02/20/1931 DOA: 05/28/2019  PCP: [name], FNP  Patient coming from: [place]  I have personally briefly reviewed patient's old medical records in [place]  Chief Complaint: Mechanical fall  HPI: Leah Contreras is a 86 y.o. female with medical history significant for TIA, hypertension, IBS constipation predominant, hyperlipidemia, prediabetes who presents with concerns of a mechanical fall.    Patient was getting onto a shuttle bus to go back to her apartment when she suddenly lost her balance on the second step and fell on her back and hit her head.  Felt like she had a brief moment of loss of consciousness.  Denies any dizziness, chest pain, shortness of breath prior to the episode.  She was otherwise in her normal state of health.  Her last and first ever fall was last December and that was also mechanical.  Patient is not on any blood thinners.  She reports having a headache and left arm pain. Also says that she has not been able to urinate since earlier this morning and feels very distended.  Patient was afebrile, hypertensive likely secondary to pain and had to be placed on 2 L via nasal cannula when she had some mild desaturation down to 92% after pain medication.  CT cervical spine negative.  CT chest with Posterior left third and 4th rib fracture.  Left shoulder X-ray Scapular fracture involving the infraspinous region CT head with mild to moderate severity scalp soft tissue swelling near the posterior aspect of the vertex on the left.  Lumbar spine X-ray shows acute L1 compression deformity with less than 25% loss of height and no retropulsion.  Leukocytosis of 12.8, mild anemia with Hgb of 11.6. Platelet of 126. Creatinine mildly elevated at 1.05.    Review of Systems: Constitutional: No Weight Change, No Fever ENT/Mouth: No sore throat, No Rhinorrhea Eyes: No Eye Pain, No Vision  Changes Cardiovascular: No Chest Pain, no SOB Respiratory: No Cough, No Sputum Gastrointestinal: No Nausea, No Vomiting, No Diarrhea, + Constipation, No Pain Genitourinary: no Urinary Incontinence, No Urgency, No Flank Pain Musculoskeletal: No Arthralgias, No Myalgias Skin: No Skin Lesions, No Pruritus, Neuro: no Weakness, No Numbness,  + Loss of Consciousness, + Syncope Psych: No Anxiety/Panic, No Depression, no decrease appetite Heme/Lymph: No Bruising, No Bleeding  Past Medical History:  Diagnosis Date  . Cervical radiculopathy   . Constipation   . Fibromyalgia   . Gait instability   . GERD (gastroesophageal reflux disease)   . Glaucoma   . Glaucoma   . Hyperlipidemia   . Hypertension   . IBS (irritable bowel syndrome)   . Insomnia   . Iron deficiency   . Irregular heart rhythm   . Mild cognitive impairment   . Osteoarthritis   . RLS (restless legs syndrome)   . TIA (transient ischemic attack)   . Type 2 diabetes mellitus ([place])     Past Surgical History:  Procedure Laterality Date  . ABDOMINAL HYSTERECTOMY    . SKIN CANCER EXCISION    . TONSILLECTOMY       reports that she has never smoked. She has never used smokeless tobacco. She reports previous alcohol use. She reports that she does not use drugs.  Allergies  Allergen Reactions  . Codeine Sulfate [Codeine]     Rash   . Erythromycin     Rash   . Tramadol     Family History  Problem Relation  Age of Onset  . Heart disease Mother   . Diabetes type II Mother   . Neurologic Disorder Mother   . Heart disease Father      Prior to Admission medications   Medication Sig Start Date End Date Taking? Authorizing Provider  aspirin (ASPIRIN 81) 81 MG EC tablet Take 81 mg by mouth daily. Swallow whole.   Yes [provider]  cetirizine (ZYRTEC) 10 MG tablet Take 10 mg by mouth daily.   Yes [provider]  cyanocobalamin 2000 MCG tablet Take 2,000 mcg by mouth daily.   Yes [provider]  Diclofenac Sodium 3 % GEL Apply topically as needed.    Yes [provider]  docusate sodium (COLACE) 100 MG capsule Take 100 mg by mouth 2 (two) times daily.   Yes [provider]  ferrous sulfate 325 (65 FE) MG tablet Take 325 mg by mouth daily with breakfast.   Yes [provider]  furosemide (LASIX) 20 MG tablet Take 0.5 tablets (10 mg total) by mouth daily. 05/03/19  Yes Agbor-Etang, Arlys John, MD  lidocaine (LIDODERM) 5 % Place 1 patch onto the skin daily. Remove & Discard patch within 12 hours or as directed by MD   Yes [provider]  lisinopril (ZESTRIL) 20 MG tablet Take 20 mg by mouth daily.   Yes [provider]  methocarbamol (ROBAXIN) 500 MG tablet Take 500 mg by mouth at bedtime. 05/10/19  Yes [provider]  metoprolol succinate (TOPROL-XL) 25 MG 24 hr tablet Take 25 mg by mouth daily.   Yes [provider]  mirtazapine (REMERON) 15 MG tablet Take 15 mg by mouth at bedtime.   Yes [provider]  omeprazole (PRILOSEC) 20 MG capsule Take 20 mg by mouth daily.   Yes [provider]  pregabalin (LYRICA) 75 MG capsule Take 75 mg by mouth 2 (two) times daily.   Yes [provider]  rosuvastatin (CRESTOR) 10 MG tablet Take 10 mg by mouth daily.   Yes [provider]  senna (SENOKOT) 8.6 MG TABS tablet Take 2 tablets by mouth at bedtime as needed. 05/07/19  Yes [provider]  timolol (TIMOPTIC) 0.5 % ophthalmic solution 1 drop 2 (two) times daily.   Yes [provider]    Physical Exam: Vitals:   05/28/19 1657 05/28/19 1930 05/28/19 2030 05/28/19 2307  BP:  (!) 139/96 (!) 166/83 (!) 174/78  Pulse:  95 94 91  Resp:  16  17  Temp:      TempSrc:      SpO2:  98% 97% 97%  Weight: 74.8 kg     Height: 5\' 5"  (1.651 m)       Constitutional: NAD, calm, elderly female laying in bed Vitals:   05/28/19 1657 05/28/19 1930 05/28/19 2030 05/28/19 2307  BP:  (!) 139/96 (!)  166/83 (!) 174/78  Pulse:  95 94 91  Resp:  16  17  Temp:      TempSrc:      SpO2:  98% 97% 97%  Weight: 74.8 kg     Height: 5\' 5"  (1.651 m)      Eyes: PERRL, lids and conjunctivae normal ENMT: Mucous membranes are moist.  Neck: normal, supple Respiratory: clear to auscultation bilaterally, no wheezing, no crackles. Normal respiratory effort on 2L. No accessory muscle use.  Cardiovascular: Regular rate and rhythm, no murmurs / rubs / gallops. No extremity edema. 2+ pedal pulses.  Abdomen: no tenderness, no masses  palpated.Bowel sounds positive.  Musculoskeletal: no clubbing / cyanosis. No joint deformity upper and lower extremities.  Pain to left neck and shoulder with right head rotation.  Pain with lifting or flexion of the left upper extremity.  Bilateral knee pain with flexion of the lower extremity which patient reports is chronic.  4 out of 5 strength of bilateral lower extremity.  No pain with palpation of bilateral hip groin region. Skin: no rashes, lesions, ulcers. No induration Neurologic: Dry blood noted to left scalp with small area of edema, CN 2-12 grossly intact. Sensation intact, Strength 5/5 in all 4.  Psychiatric: Normal judgment and insight. Alert and oriented x 3. Normal mood.     Labs on Admission: I have personally reviewed following labs and imaging studies  CBC: Recent Labs  Lab 05/28/19 1909  WBC 12.8*  HGB 11.6*  HCT 35.3*  MCV 89.4  PLT 127*   Basic Metabolic Panel: Recent Labs  Lab 05/28/19 1909  NA 140  K 4.4  CL 108  CO2 22  GLUCOSE 121*  BUN 20  CREATININE 1.05*  CALCIUM 10.0   GFR: Estimated Creatinine Clearance: 38.9 mL/min (A) (by C-G formula based on SCr of 1.05 mg/dL (H)). Liver Function Tests: No results for input(s): AST, ALT, ALKPHOS, BILITOT, PROT, ALBUMIN in the last 168 hours. No results for input(s): LIPASE, AMYLASE in the last 168 hours. No results for input(s): AMMONIA in the last 168 hours. Coagulation Profile: No  results for input(s): INR, PROTIME in the last 168 hours. Cardiac Enzymes: No results for input(s): CKTOTAL, CKMB, CKMBINDEX, TROPONINI in the last 168 hours. BNP (last 3 results) No results for input(s): PROBNP in the last 8760 hours. HbA1C: No results for input(s): HGBA1C in the last 72 hours. CBG: No results for input(s): GLUCAP in the last 168 hours. Lipid Profile: No results for input(s): CHOL, HDL, LDLCALC, TRIG, CHOLHDL, LDLDIRECT in the last 72 hours. Thyroid Function Tests: No results for input(s): TSH, T4TOTAL, FREET4, T3FREE, THYROIDAB in the last 72 hours. Anemia Panel: No results for input(s): VITAMINB12, FOLATE, FERRITIN, TIBC, IRON, RETICCTPCT in the last 72 hours. Urine analysis:    Component Value Date/Time   COLORURINE YELLOW (A) 05/28/2019 2253   APPEARANCEUR CLEAR (A) 05/28/2019 2253   LABSPEC 1.010 05/28/2019 2253   PHURINE 7.0 05/28/2019 2253   GLUCOSEU NEGATIVE 05/28/2019 2253   HGBUR NEGATIVE 05/28/2019 2253   BILIRUBINUR NEGATIVE 05/28/2019 2253   KETONESUR NEGATIVE 05/28/2019 2253   PROTEINUR NEGATIVE 05/28/2019 2253   NITRITE NEGATIVE 05/28/2019 2253   LEUKOCYTESUR NEGATIVE 05/28/2019 2253    Radiological Exams on Admission: DG Lumbar Spine Complete  Result Date: 05/28/2019 CLINICAL DATA:  Larey Seat backwards, lumbar spine pain EXAM: LUMBAR SPINE - COMPLETE 4+ VIEW COMPARISON:  None. FINDINGS: Frontal, bilateral oblique, and lateral views of the lumbar spine are obtained. There are 5 non-rib-bearing lumbar type vertebral bodies. There is an acute L1 compression deformity with fracture line seen through the anterior aspect of the vertebral body. Less than 25% loss of height. No retropulsion. There is disc space narrowing at L5/S1. Diffuse facet hypertrophy greatest at the lumbosacral junction. Sacroiliac joints are normal. IMPRESSION: 1. Acute L1 compression deformity with less than 25% loss of height and no retropulsion. 2. Lower lumbar spondylosis and facet  hypertrophy. Electronically Signed   By: Sharlet Salina M.D.   On: 05/28/2019 18:26   CT Head Wo Contrast  Result Date: 05/28/2019 CLINICAL DATA:  Status post fall. EXAM: CT HEAD WITHOUT CONTRAST TECHNIQUE:  Contiguous axial images were obtained from the base of the skull through the vertex without intravenous contrast. COMPARISON:  None. FINDINGS: Brain: There is mild cerebral atrophy with widening of the extra-axial spaces and ventricular dilatation. There are areas of decreased attenuation within the white matter tracts of the supratentorial brain, consistent with microvascular disease changes. Mild bilateral basal ganglia calcification is seen, right greater than left. Vascular: No hyperdense vessel or unexpected calcification. Skull: Normal. Negative for fracture or focal lesion. Sinuses/Orbits: No acute finding. Other: Mild-to-moderate severity scalp soft tissue swelling is seen near the posterior aspect of the vertex on the left. IMPRESSION: 1. Mild-to-moderate severity scalp soft tissue swelling near the posterior aspect of the vertex on the left. 2. No acute intracranial abnormality. Electronically Signed   By: Aram Candela M.D.   On: 05/28/2019 18:26   CT Chest Wo Contrast  Result Date: 05/28/2019 CLINICAL DATA:  Status post fall. EXAM: CT CHEST WITHOUT CONTRAST TECHNIQUE: Multidetector CT imaging of the chest was performed following the standard protocol without IV contrast. COMPARISON:  None. FINDINGS: Cardiovascular: There is moderate severity calcification of the thoracic aorta. Normal heart size. No pericardial effusion. Mediastinum/Nodes: No enlarged mediastinal or axillary lymph nodes. Thyroid gland, trachea, and esophagus demonstrate no significant findings. Lungs/Pleura: Lungs are clear. No pleural effusion or pneumothorax. Upper Abdomen: Noninflamed diverticula are seen throughout the large bowel. Musculoskeletal: Acute fractures of the posterior third and fourth left ribs are seen.  Chronic lateral fifth, sixth and seventh right rib fractures are noted. Moderate to marked severity multilevel degenerative changes seen throughout the thoracic spine. IMPRESSION: 1. Acute posterior third and fourth left rib fractures. 2. Noninflamed diverticula throughout the large bowel. 3. Moderate to marked severity multilevel degenerative changes throughout the thoracic spine. Aortic Atherosclerosis (ICD10-I70.0). Electronically Signed   By: Aram Candela M.D.   On: 05/28/2019 19:27   CT Cervical Spine Wo Contrast  Result Date: 05/28/2019 CLINICAL DATA:  Larey Seat, back and left shoulder pain EXAM: CT CERVICAL SPINE WITHOUT CONTRAST TECHNIQUE: Multidetector CT imaging of the cervical spine was performed without intravenous contrast. Multiplanar CT image reconstructions were also generated. COMPARISON:  None. FINDINGS: Alignment: Alignment is anatomic. Skull base and vertebrae: No acute displaced cervical spine fractures. Soft tissues and spinal canal: No prevertebral fluid or swelling. No visible canal hematoma. Disc levels: Multilevel spondylosis and facet hypertrophy greatest from C2 through C5. Upper chest: Displaced left posterior third rib fracture is partially visualized. There is adjacent soft tissue swelling. Please refer to left shoulder x-ray report. Other: Reconstructed images demonstrate no additional findings. IMPRESSION: 1. No acute cervical spine fracture. 2. Posterior left third rib fracture with associated chest wall soft tissue swelling. Please refer to left shoulder x-ray report. Electronically Signed   By: Sharlet Salina M.D.   On: 05/28/2019 18:23   DG Shoulder Left  Result Date: 05/28/2019 CLINICAL DATA:  Larey Seat, left shoulder pain EXAM: LEFT SHOULDER - 2+ VIEW COMPARISON:  None. FINDINGS: Frontal, transscapular, and axillary views of the left shoulder are obtained. There are displaced left posterior third and fourth rib fractures. I do not see any pneumothorax. There is irregular  lucency in the infraspinous region the scapula suspicious for fracture. There is glenohumeral and acromioclavicular joint osteoarthritis. IMPRESSION: 1. Displaced left posterior third and fourth rib fractures without definite pneumothorax. 2. Scapular fracture involving the infraspinous region. No definite intra-articular extension. 3. Osteoarthritis. Electronically Signed   By: Sharlet Salina M.D.   On: 05/28/2019 18:25      Assessment/Plan  Mechanical fall with numerous fracture as stated below Managed with conservative management.  Will have PT/OT evaluate.  posterior left third and fourth rib fracture Conservative management with pain control  Left scapular infraspinatus fracture Conservative management pain control.  Patient up out of arm sling for now since she has more severe pain when attempting to put her arm into flexion  Acute L1 compression deformity Conservative management with pain control  Left scalp soft tissue swelling CT head with no intracranial abnormalities.  Patient alert and oriented x3  Acute urinary retention Unclear etiology and unlikely to be related to her back injury based on imaging.  Greater than 800 cc noted on bladder scan.  Could be secondary to pain medication. Will place Foley.  Obtain UA.  History of TIA Continue aspirin  Hypertension Continue lisinopril, metoprolol  Hyperlipidemia Continue statin  DVT prophylaxis:.SCDs Code Status: Full Family Communication: Plan discussed with patient and daughter at bedside.  All questions and concerns were answered. disposition Plan: Home with observation Consults called:  Admission status: Observation   Status is: Observation  The patient remains OBS appropriate and will d/c before 2 midnights.  Dispo: The patient is from: Home              Anticipated d/c is to: Home              Anticipated d/c date is: 1 day              Patient currently is not medically stable to d/c.          Anselm Junglinghing T Avinash Maltos DO [facility]   If 7PM-7AM, please contact night-coverage [email or web address]   05/28/2019, 11:47 PM

## 2019-05-28 NOTE — ED Notes (Signed)
Bladder scan reveals greater than . MD notified.

## 2019-05-29 DIAGNOSIS — D72829 Elevated white blood cell count, unspecified: Secondary | ICD-10-CM | POA: Diagnosis present

## 2019-05-29 DIAGNOSIS — E611 Iron deficiency: Secondary | ICD-10-CM | POA: Diagnosis present

## 2019-05-29 DIAGNOSIS — Z8673 Personal history of transient ischemic attack (TIA), and cerebral infarction without residual deficits: Secondary | ICD-10-CM | POA: Diagnosis not present

## 2019-05-29 DIAGNOSIS — I10 Essential (primary) hypertension: Secondary | ICD-10-CM | POA: Diagnosis present

## 2019-05-29 DIAGNOSIS — S42112A Displaced fracture of body of scapula, left shoulder, initial encounter for closed fracture: Secondary | ICD-10-CM | POA: Diagnosis present

## 2019-05-29 DIAGNOSIS — Y92811 Bus as the place of occurrence of the external cause: Secondary | ICD-10-CM | POA: Diagnosis not present

## 2019-05-29 DIAGNOSIS — E119 Type 2 diabetes mellitus without complications: Secondary | ICD-10-CM | POA: Diagnosis present

## 2019-05-29 DIAGNOSIS — K589 Irritable bowel syndrome without diarrhea: Secondary | ICD-10-CM | POA: Diagnosis present

## 2019-05-29 DIAGNOSIS — S32010A Wedge compression fracture of first lumbar vertebra, initial encounter for closed fracture: Secondary | ICD-10-CM | POA: Diagnosis not present

## 2019-05-29 DIAGNOSIS — S42192D Fracture of other part of scapula, left shoulder, subsequent encounter for fracture with routine healing: Secondary | ICD-10-CM | POA: Diagnosis not present

## 2019-05-29 DIAGNOSIS — S2241XD Multiple fractures of ribs, right side, subsequent encounter for fracture with routine healing: Secondary | ICD-10-CM | POA: Diagnosis not present

## 2019-05-29 DIAGNOSIS — I951 Orthostatic hypotension: Secondary | ICD-10-CM | POA: Diagnosis not present

## 2019-05-29 DIAGNOSIS — S0003XA Contusion of scalp, initial encounter: Secondary | ICD-10-CM | POA: Diagnosis present

## 2019-05-29 DIAGNOSIS — S2239XA Fracture of one rib, unspecified side, initial encounter for closed fracture: Secondary | ICD-10-CM | POA: Insufficient documentation

## 2019-05-29 DIAGNOSIS — S32019A Unspecified fracture of first lumbar vertebra, initial encounter for closed fracture: Secondary | ICD-10-CM | POA: Diagnosis present

## 2019-05-29 DIAGNOSIS — K5900 Constipation, unspecified: Secondary | ICD-10-CM | POA: Diagnosis present

## 2019-05-29 DIAGNOSIS — T07XXXA Unspecified multiple injuries, initial encounter: Secondary | ICD-10-CM | POA: Diagnosis not present

## 2019-05-29 DIAGNOSIS — R338 Other retention of urine: Secondary | ICD-10-CM | POA: Diagnosis not present

## 2019-05-29 DIAGNOSIS — K219 Gastro-esophageal reflux disease without esophagitis: Secondary | ICD-10-CM | POA: Diagnosis present

## 2019-05-29 DIAGNOSIS — D649 Anemia, unspecified: Secondary | ICD-10-CM | POA: Diagnosis present

## 2019-05-29 DIAGNOSIS — E785 Hyperlipidemia, unspecified: Secondary | ICD-10-CM | POA: Diagnosis present

## 2019-05-29 DIAGNOSIS — H409 Unspecified glaucoma: Secondary | ICD-10-CM | POA: Diagnosis present

## 2019-05-29 DIAGNOSIS — M79602 Pain in left arm: Secondary | ICD-10-CM | POA: Diagnosis present

## 2019-05-29 DIAGNOSIS — G3184 Mild cognitive impairment, so stated: Secondary | ICD-10-CM | POA: Diagnosis present

## 2019-05-29 DIAGNOSIS — R339 Retention of urine, unspecified: Secondary | ICD-10-CM | POA: Diagnosis present

## 2019-05-29 DIAGNOSIS — Z20822 Contact with and (suspected) exposure to covid-19: Secondary | ICD-10-CM | POA: Diagnosis present

## 2019-05-29 DIAGNOSIS — G4700 Insomnia, unspecified: Secondary | ICD-10-CM | POA: Diagnosis present

## 2019-05-29 DIAGNOSIS — G2581 Restless legs syndrome: Secondary | ICD-10-CM | POA: Diagnosis present

## 2019-05-29 DIAGNOSIS — M797 Fibromyalgia: Secondary | ICD-10-CM | POA: Diagnosis present

## 2019-05-29 DIAGNOSIS — S2242XD Multiple fractures of ribs, left side, subsequent encounter for fracture with routine healing: Secondary | ICD-10-CM | POA: Diagnosis not present

## 2019-05-29 DIAGNOSIS — S42109A Fracture of unspecified part of scapula, unspecified shoulder, initial encounter for closed fracture: Secondary | ICD-10-CM | POA: Diagnosis present

## 2019-05-29 DIAGNOSIS — W010XXA Fall on same level from slipping, tripping and stumbling without subsequent striking against object, initial encounter: Secondary | ICD-10-CM | POA: Diagnosis present

## 2019-05-29 DIAGNOSIS — Z85828 Personal history of other malignant neoplasm of skin: Secondary | ICD-10-CM | POA: Diagnosis not present

## 2019-05-29 DIAGNOSIS — S2242XA Multiple fractures of ribs, left side, initial encounter for closed fracture: Secondary | ICD-10-CM | POA: Diagnosis present

## 2019-05-29 MED ORDER — KETOROLAC TROMETHAMINE 30 MG/ML IJ SOLN
15.0000 mg | Freq: Three times a day (TID) | INTRAMUSCULAR | Status: DC
  Administered 2019-05-29 – 2019-06-01 (×11): 15 mg via INTRAVENOUS
  Filled 2019-05-29 (×11): qty 1

## 2019-05-29 MED ORDER — ACETAMINOPHEN 325 MG PO TABS
650.0000 mg | ORAL_TABLET | Freq: Four times a day (QID) | ORAL | Status: DC
  Administered 2019-05-29 – 2019-06-01 (×14): 650 mg via ORAL
  Filled 2019-05-29 (×14): qty 2

## 2019-05-29 NOTE — Progress Notes (Signed)
OT Cancellation Note  Patient Details Name: Leah Contreras MRN: [id number] DOB: 06/14/1931   Cancelled Treatment:    Reason Eval/Treat Not Completed: Medical issues which prohibited therapy;Other (comment). Consult received, chart reviewed. Pt with mechanical fall, imaging revealed posterior L third and 4th rib fx, acute L1 compression deformity with less than 25% loss of height, and L shoulder scapular rx involving infraspinous region. OT to hold evaluation until orthopedic consult conducted for potential LUE weight bearing restrictions. Will continue to monitor remotely and initiate services as available and pt medically appropriate for OT evaluation.   Rockney Ghee, M.S., OTR/L Ascom: [phone] 05/29/19, 2:10 PM

## 2019-05-29 NOTE — Telephone Encounter (Signed)
2nd attempt to contact the patient with results. [email or web address] 

## 2019-05-29 NOTE — Telephone Encounter (Signed)
-----   Message from Debbe Odea, MD sent at 05/22/2019  4:39 PM EDT ----- Low risk scan, very small defect in the anterior wall.  LV wall motion is normal.  Keep follow-up appointment.

## 2019-05-29 NOTE — Consult Note (Signed)
Reason for Consult: L1 fracture and scapular fracture on the left side Referring Physician: Dr.Dhungel  Leah Contreras is an 86 y.o. female.  HPI: Patient suffered a fall while outside today she fell back onto her left side and back while out with friends.  She complained of severe shoulder pain and back pain she has L1 fracture and left scapular fracture is nondisplaced.  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 Cervical radiculopathy   . Constipation   . Fibromyalgia   . Gait instability   . GERD (gastroesophageal reflux disease)   . Glaucoma   . Glaucoma   . Hyperlipidemia   . Hypertension   . IBS (irritable bowel syndrome)   . Insomnia   . Iron deficiency   . Irregular heart rhythm   . Mild cognitive impairment   . Osteoarthritis   . RLS (restless legs syndrome)   . TIA (transient ischemic attack)   . Type 2 diabetes mellitus (HCC)     Past Surgical History:  Procedure Laterality Date  . ABDOMINAL HYSTERECTOMY    . SKIN CANCER EXCISION    . TONSILLECTOMY      Family History  Problem Relation Age of Onset  . Heart disease Mother   . Diabetes type II Mother   . Neurologic Disorder Mother   . Heart disease Father     Social History:  reports that she has never smoked. She has never used smokeless tobacco. She reports previous alcohol use. She reports that she does not use drugs.  Allergies:  Allergies  Allergen Reactions  . Codeine Sulfate [Codeine]     Rash   . Erythromycin     Rash   . Tramadol     Medications: I have reviewed the patient's current medications.  Results for orders placed or performed during the hospital encounter of 05/28/19 (from the past 48 hour(s))  CBC     Status: Abnormal   Collection Time: 05/28/19  7:09 PM  Result Value Ref Range   WBC 12.8 (H) 4.0 - 10.5 K/uL   RBC 3.95 3.87 - 5.11 MIL/uL   Hemoglobin 11.6 (L) 12.0 - 15.0 g/dL   HCT 01.0 (L) 93.2 - 35.5 %   MCV 89.4 80.0  - 100.0 fL   MCH 29.4 26.0 - 34.0 pg   MCHC 32.9 30.0 - 36.0 g/dL   RDW 73.2 20.2 - 54.2 %   Platelets 127 (L) 150 - 400 K/uL   nRBC 0.0 0.0 - 0.2 %    Comment: Performed at [facility], [address] [place], [place] [address]  Basic metabolic panel     Status: Abnormal   Collection Time: 05/28/19  7:09 PM  Result Value Ref Range   Sodium 140 135 - 145 mmol/L   Potassium 4.4 3.5 - 5.1 mmol/L   Chloride 108 98 - 111 mmol/L   CO2 22 22 - 32 mmol/L   Glucose, Bld 121 (H) 70 - 99 mg/dL    Comment: Glucose reference range applies only to samples taken after fasting for at least 8 hours.   BUN 20 8 - 23 mg/dL   Creatinine, Ser 7.62 (H) 0.44 - 1.00 mg/dL   Calcium 83.1 8.9 - 51.7 mg/dL   GFR calc non Af Amer 48 (L) >60 mL/min   GFR calc Af Amer 56 (L) >60 mL/min   Anion gap 10 5 - 15    Comment: Performed at [facility], [address], [place], [place] [address]  SARS Coronavirus 2 by RT PCR (hospital order, performed in [facility] hospital lab) Nasopharyngeal Nasopharyngeal Swab     Status: None   Collection Time: 05/28/19  8:03 PM   Specimen: Nasopharyngeal Swab  Result Value Ref Range   SARS Coronavirus 2 NEGATIVE NEGATIVE    Comment: (NOTE) SARS-CoV-2 target nucleic acids are NOT DETECTED. The SARS-CoV-2 RNA is generally detectable in upper and lower respiratory specimens during the acute phase of infection. The lowest concentration of SARS-CoV-2 viral copies this assay can detect is 250 copies / mL. A negative result does not preclude SARS-CoV-2 infection and should not be used as the sole basis for treatment or other patient management decisions.  A negative result may occur with improper specimen collection / handling, submission of specimen other than nasopharyngeal swab, presence of viral mutation(s) within the areas targeted by this assay, and inadequate number of viral copies (<250 copies / mL). A negative result must be combined with  clinical observations, patient history, and epidemiological information. Fact Sheet for Patients:   StrictlyIdeas.no Fact Sheet for Healthcare Providers: BankingDealers.co.za This test is not yet approved or cleared  by the [place] FDA and has been authorized for detection and/or diagnosis of SARS-CoV-2 by FDA under an Emergency Use Authorization (EUA).  This EUA will remain in effect (meaning this test can be used) for the duration of the COVID-19 declaration under Section 564(b)(1) of the Act, 21 U.S.C. section 360bbb-3(b)(1), unless the authorization is terminated or revoked sooner. Performed at [facility], [address] [place], [place] [address]   Urinalysis, Routine w reflex microscopic     Status: Abnormal   Collection Time: 05/28/19 10:53 PM  Result Value Ref Range   Color, Urine YELLOW (A) YELLOW   APPearance CLEAR (A) CLEAR   Specific Gravity, Urine 1.010 1.005 - 1.030   pH 7.0 5.0 - 8.0   Glucose, UA NEGATIVE NEGATIVE mg/dL   Hgb urine dipstick NEGATIVE NEGATIVE   Bilirubin Urine NEGATIVE NEGATIVE   Ketones, ur NEGATIVE NEGATIVE mg/dL   Protein, ur NEGATIVE NEGATIVE mg/dL   Nitrite NEGATIVE NEGATIVE   Leukocytes,Ua NEGATIVE NEGATIVE    Comment: Performed at [facility], [address], [place], [place] [address]    DG Lumbar Spine Complete  Result Date: 05/28/2019 CLINICAL DATA:  [place] backwards, lumbar spine pain EXAM: LUMBAR SPINE - COMPLETE 4+ VIEW COMPARISON:  None. FINDINGS: Frontal, bilateral oblique, and lateral views of the lumbar spine are obtained. There are 5 non-rib-bearing lumbar type vertebral bodies. There is an acute L1 compression deformity with fracture line seen through the anterior aspect of the vertebral body. Less than 25% loss of height. No retropulsion. There is disc space narrowing at L5/S1. Diffuse facet hypertrophy greatest at the lumbosacral junction. Sacroiliac joints  are normal. IMPRESSION: 1. Acute L1 compression deformity with less than 25% loss of height and no retropulsion. 2. Lower lumbar spondylosis and facet hypertrophy. Electronically Signed   By: Randa Ngo M.D.   On: 05/28/2019 18:26   CT Head Wo Contrast  Result Date: 05/28/2019 CLINICAL DATA:  Status post fall. EXAM: CT HEAD WITHOUT CONTRAST TECHNIQUE: Contiguous axial images were obtained from the base of the skull through the vertex without intravenous contrast. COMPARISON:  None. FINDINGS: Brain: There is mild cerebral atrophy with widening of the extra-axial spaces and ventricular dilatation. There are areas of decreased attenuation within the white matter tracts of the supratentorial brain, consistent  with microvascular disease changes. Mild bilateral basal ganglia calcification is seen, right greater than left. Vascular: No hyperdense vessel or unexpected calcification. Skull: Normal. Negative for fracture or focal lesion. Sinuses/Orbits: No acute finding. Other: Mild-to-moderate severity scalp soft tissue swelling is seen near the posterior aspect of the vertex on the left. IMPRESSION: 1. Mild-to-moderate severity scalp soft tissue swelling near the posterior aspect of the vertex on the left. 2. No acute intracranial abnormality. Electronically Signed   By: Aram Candela M.D.   On: 05/28/2019 18:26   CT Chest Wo Contrast  Result Date: 05/28/2019 CLINICAL DATA:  Status post fall. EXAM: CT CHEST WITHOUT CONTRAST TECHNIQUE: Multidetector CT imaging of the chest was performed following the standard protocol without IV contrast. COMPARISON:  None. FINDINGS: Cardiovascular: There is moderate severity calcification of the thoracic aorta. Normal heart size. No pericardial effusion. Mediastinum/Nodes: No enlarged mediastinal or axillary lymph nodes. Thyroid gland, trachea, and esophagus demonstrate no significant findings. Lungs/Pleura: Lungs are clear. No pleural effusion or pneumothorax. Upper  Abdomen: Noninflamed diverticula are seen throughout the large bowel. Musculoskeletal: Acute fractures of the posterior third and fourth left ribs are seen. Chronic lateral fifth, sixth and seventh right rib fractures are noted. Moderate to marked severity multilevel degenerative changes seen throughout the thoracic spine. IMPRESSION: 1. Acute posterior third and fourth left rib fractures. 2. Noninflamed diverticula throughout the large bowel. 3. Moderate to marked severity multilevel degenerative changes throughout the thoracic spine. Aortic Atherosclerosis (ICD10-I70.0). Electronically Signed   By: Aram Candela M.D.   On: 05/28/2019 19:27   CT Cervical Spine Wo Contrast  Result Date: 05/28/2019 CLINICAL DATA:  Larey Seat, back and left shoulder pain EXAM: CT CERVICAL SPINE WITHOUT CONTRAST TECHNIQUE: Multidetector CT imaging of the cervical spine was performed without intravenous contrast. Multiplanar CT image reconstructions were also generated. COMPARISON:  None. FINDINGS: Alignment: Alignment is anatomic. Skull base and vertebrae: No acute displaced cervical spine fractures. Soft tissues and spinal canal: No prevertebral fluid or swelling. No visible canal hematoma. Disc levels: Multilevel spondylosis and facet hypertrophy greatest from C2 through C5. Upper chest: Displaced left posterior third rib fracture is partially visualized. There is adjacent soft tissue swelling. Please refer to left shoulder x-ray report. Other: Reconstructed images demonstrate no additional findings. IMPRESSION: 1. No acute cervical spine fracture. 2. Posterior left third rib fracture with associated chest wall soft tissue swelling. Please refer to left shoulder x-ray report. Electronically Signed   By: Sharlet Salina M.D.   On: 05/28/2019 18:23   DG Shoulder Left  Result Date: 05/28/2019 CLINICAL DATA:  Larey Seat, left shoulder pain EXAM: LEFT SHOULDER - 2+ VIEW COMPARISON:  None. FINDINGS: Frontal, transscapular, and axillary  views of the left shoulder are obtained. There are displaced left posterior third and fourth rib fractures. I do not see any pneumothorax. There is irregular lucency in the infraspinous region the scapula suspicious for fracture. There is glenohumeral and acromioclavicular joint osteoarthritis. IMPRESSION: 1. Displaced left posterior third and fourth rib fractures without definite pneumothorax. 2. Scapular fracture involving the infraspinous region. No definite intra-articular extension. 3. Osteoarthritis. Electronically Signed   By: Sharlet Salina M.D.   On: 05/28/2019 18:25    Review of Systems Blood pressure (!) 110/57, pulse 93, temperature 99.3 F (37.4 C), temperature source Oral, resp. rate 18, height 5\' 5"  (1.651 m), weight 74.8 kg, SpO2 96 %. Physical Exam She is tender in the midline at L1 with out significant kyphotic deformity.  She is able to flex extend the toes  quad function intact no clonus.  Sensation intact to lower extremities Upper extremity she has intact sensation to the left hand with severe tenderness about the posterior shoulder towards the midline consistent with scapular fracture. Assessment/Plan: 1.  L1 compression fracture.  Recommend nonoperative treatment initially and hope this will resolve fairly quickly.  There is minimal deformity at present.  If her pain persists or has significant worsening kyphoplasty is an option. 2.  Scapular fracture.  This is minimally displaced and with the muscle around the scapula it should heal over the next 4 to 6 weeks at which time she will need some rotator cuff rehab.  With this and her rib fractures she will not be able bear weight on her left side will need a hemiwalker with physical therapy.  Suspect she will need rehabilitation stay to regain independence.  Kennedy Bucker 05/29/2019, 4:02 PM

## 2019-05-29 NOTE — Progress Notes (Signed)
PT Cancellation Note  Patient Details Name: Leah Contreras MRN: [id number] DOB: Nov 06, 1931   Cancelled Treatment:    Reason Eval/Treat Not Completed: Other (comment). Consult received, chart reviewed. Pt with mechanical fall, imaging revealed posterior L third and 4th rib fx, acute L1 compression deformity with less than 25% loss of height, and L shoulder scapular rx involving infraspinous region. PT to hold evaluation until orthopedic consult conducted for potential LUE weight bearing restrictions.  Olga Coaster PT, DPT 1:28 PM,05/29/19

## 2019-05-29 NOTE — ED Notes (Signed)
Dinner given to pt 

## 2019-05-29 NOTE — ED Notes (Addendum)
Attempted to call report to 1A. Charge nurse made aware to have assigned nurse call this RN back with questions once the chart has been reviewed within 15 minutes.

## 2019-05-29 NOTE — Progress Notes (Addendum)
PROGRESS NOTE    Leah Contreras  PPI:[id number] DOB: 1931-06-25 DOA: 05/28/2019 PCP: Melonie Florida, FNP    Chief Complaint  Patient presents with  . Fall    Brief Narrative: 86 year old female resident of independent living with history of TIA, hypertension, IBS, hyperlipidemia, prediabetes presented with mechanical fall when she was taking a shuttle bus and lost her balance.  Reports she normally uses a walker at home.  Patient hit her head and thinks to me have lost her consciousness briefly.  Denies any dizziness, shortness of breath, loss of bowel or bladder control.  Has had a mechanical fall 5 months back and fractured few of her right ribs.  Patient also reported being unable to urinate since the morning of admission. In the ED patient was hypertensive.  CT of the head showed soft tissue swelling of the posterior scalp.  CT of the cervical spine negative for any injury.  CT of the chest showed posterior left third and fourth rib fracture.  X-ray of the left shoulder shows scapular fracture involving the infraspinous region.  X-ray of the lumbar spine showed acute L1 compression deformity less than 25% loss of height. Patient placed in observation for multiple fractures and pain control.      Assessment & Plan:   Principal Problem:   Fall, mechanical Associated with multiple fractures involving left third and fourth rib, left scapular infraspinatus, compression deformity. Pain not controlled on as needed oxycodone and Tylenol.  I will switch to scheduled Tylenol 650 mg every 6 hours and IV Toradol 15 mg every 8 hours.  Continue as needed oxycodone every 6 hours. PT/OT eval.  Patient unable to move her left arm due to severe pain and unable to get out of bed.  Active Problems: Left third and fourth posterior rib fracture Pain control as above.  Incentive spirometry.  Left scapular infraspinous fracture Pain control as above.  We will consult orthopedics to evaluate and  recommend nonweightbearing status of the left upper extremity before working with PT/OT.  Was unable to tolerate arm sling with severe pain.  Acute L1 compression deformity Pain control and therapy as above.  Left shoulder swelling Head CT without other acute findings.  No neurological deficit.  Acute urinary retention No clear etiology.  Was found to have a >800 cc urine in the bladder.  Foley placed.  Check UA and renal ultrasound.  Essential hypertension Stable.  Continue home meds     DVT prophylaxis: Subcu Lovenox Code Status: Full code Family Communication: None Disposition:   Status is: Observation  The patient will require care spanning > 2 midnights and should be moved to inpatient because: Ongoing active pain requiring inpatient pain management.  Patient has multiple fractures requiring IV pain medications and unable to ambulate at present due to pain.  She is unsafe to be discharged home and requires close monitoring for adequate pain control.  Dispo: The patient is from: Home              Anticipated d/c is to: Home              Anticipated d/c date is: 2 days              Patient currently is not medically stable to d/c.       Consultants:   None   Procedures: CT head, cervical spine, CT chest   Antimicrobials:    Subjective: C/o severe pain over left ribs and shoulder area with inability move her  left arm due to pain  Objective: Vitals:   05/29/19 0305 05/29/19 0330 05/29/19 0359 05/29/19 0530  BP: 130/60 (!) 127/58 (!) 127/58 (!) 112/58  Pulse: 93 92 91 85  Resp: 16  17 18   Temp:      TempSrc:      SpO2: 95% 95% 96% 96%  Weight:      Height:       No intake or output data in the 24 hours ending 05/29/19 0851 Filed Weights   05/28/19 1657  Weight: 74.8 kg    Examination:  In some distress with pain  HEENT: moist mucosa, supple neck  chest: clear b/l, pain on minimal pressure over left ribs  cvs: ns1&s2, no murmurs GI: soft, NT,  ND  foley+ Musculoskeletal: warm, no edema    Data Reviewed: I have personally reviewed following labs and imaging studies  CBC: Recent Labs  Lab 05/28/19 1909  WBC 12.8*  HGB 11.6*  HCT 35.3*  MCV 89.4  PLT 127*    Basic Metabolic Panel: Recent Labs  Lab 05/28/19 1909  NA 140  K 4.4  CL 108  CO2 22  GLUCOSE 121*  BUN 20  CREATININE 1.05*  CALCIUM 10.0    GFR: Estimated Creatinine Clearance: 38.9 mL/min (A) (by C-G formula based on SCr of 1.05 mg/dL (H)).  Liver Function Tests: No results for input(s): AST, ALT, ALKPHOS, BILITOT, PROT, ALBUMIN in the last 168 hours.  CBG: No results for input(s): GLUCAP in the last 168 hours.   Recent Results (from the past 240 hour(s))  SARS Coronavirus 2 by RT PCR (hospital order, performed in [facility] hospital lab) Nasopharyngeal Nasopharyngeal Swab     Status: None   Collection Time: 05/28/19  8:03 PM   Specimen: Nasopharyngeal Swab  Result Value Ref Range Status   SARS Coronavirus 2 NEGATIVE NEGATIVE Final    Comment: (NOTE) SARS-CoV-2 target nucleic acids are NOT DETECTED. The SARS-CoV-2 RNA is generally detectable in upper and lower respiratory specimens during the acute phase of infection. The lowest concentration of SARS-CoV-2 viral copies this assay can detect is 250 copies / mL. A negative result does not preclude SARS-CoV-2 infection and should not be used as the sole basis for treatment or other patient management decisions.  A negative result may occur with improper specimen collection / handling, submission of specimen other than nasopharyngeal swab, presence of viral mutation(s) within the areas targeted by this assay, and inadequate number of viral copies (<250 copies / mL). A negative result must be combined with clinical observations, patient history, and epidemiological information. Fact Sheet for Patients:   05/30/19 Fact Sheet for Healthcare  Providers: Boiler[employer]com.cy This test is not yet approved or cleared  by the https://pope.com/ FDA and has been authorized for detection and/or diagnosis of SARS-CoV-2 by FDA under an Emergency Use Authorization (EUA).  This EUA will remain in effect (meaning this test can be used) for the duration of the COVID-19 declaration under Section 564(b)(1) of the Act, 21 U.S.C. section 360bbb-3(b)(1), unless the authorization is terminated or revoked sooner. Performed at [facility], [address] [place], [place]          Radiology Studies: DG Lumbar Spine Complete  Result Date: 05/28/2019 CLINICAL DATA:  05/30/2019 backwards, lumbar spine pain EXAM: LUMBAR SPINE - COMPLETE 4+ VIEW COMPARISON:  None. FINDINGS: Frontal, bilateral oblique, and lateral views of the lumbar spine are obtained. There are 5 non-rib-bearing lumbar type vertebral bodies. There is  an acute L1 compression deformity with fracture line seen through the anterior aspect of the vertebral body. Less than 25% loss of height. No retropulsion. There is disc space narrowing at L5/S1. Diffuse facet hypertrophy greatest at the lumbosacral junction. Sacroiliac joints are normal. IMPRESSION: 1. Acute L1 compression deformity with less than 25% loss of height and no retropulsion. 2. Lower lumbar spondylosis and facet hypertrophy. Electronically Signed   By: Randa Ngo M.D.   On: 05/28/2019 18:26   CT Head Wo Contrast  Result Date: 05/28/2019 CLINICAL DATA:  Status post fall. EXAM: CT HEAD WITHOUT CONTRAST TECHNIQUE: Contiguous axial images were obtained from the base of the skull through the vertex without intravenous contrast. COMPARISON:  None. FINDINGS: Brain: There is mild cerebral atrophy with widening of the extra-axial spaces and ventricular dilatation. There are areas of decreased attenuation within the white matter tracts of the supratentorial brain, consistent with microvascular disease  changes. Mild bilateral basal ganglia calcification is seen, right greater than left. Vascular: No hyperdense vessel or unexpected calcification. Skull: Normal. Negative for fracture or focal lesion. Sinuses/Orbits: No acute finding. Other: Mild-to-moderate severity scalp soft tissue swelling is seen near the posterior aspect of the vertex on the left. IMPRESSION: 1. Mild-to-moderate severity scalp soft tissue swelling near the posterior aspect of the vertex on the left. 2. No acute intracranial abnormality. Electronically Signed   By: Virgina Norfolk M.D.   On: 05/28/2019 18:26   CT Chest Wo Contrast  Result Date: 05/28/2019 CLINICAL DATA:  Status post fall. EXAM: CT CHEST WITHOUT CONTRAST TECHNIQUE: Multidetector CT imaging of the chest was performed following the standard protocol without IV contrast. COMPARISON:  None. FINDINGS: Cardiovascular: There is moderate severity calcification of the thoracic aorta. Normal heart size. No pericardial effusion. Mediastinum/Nodes: No enlarged mediastinal or axillary lymph nodes. Thyroid gland, trachea, and esophagus demonstrate no significant findings. Lungs/Pleura: Lungs are clear. No pleural effusion or pneumothorax. Upper Abdomen: Noninflamed diverticula are seen throughout the large bowel. Musculoskeletal: Acute fractures of the posterior third and fourth left ribs are seen. Chronic lateral fifth, sixth and seventh right rib fractures are noted. Moderate to marked severity multilevel degenerative changes seen throughout the thoracic spine. IMPRESSION: 1. Acute posterior third and fourth left rib fractures. 2. Noninflamed diverticula throughout the large bowel. 3. Moderate to marked severity multilevel degenerative changes throughout the thoracic spine. Aortic Atherosclerosis (ICD10-I70.0). Electronically Signed   By: Virgina Norfolk M.D.   On: 05/28/2019 19:27   CT Cervical Spine Wo Contrast  Result Date: 05/28/2019 CLINICAL DATA:  [place], back and left  shoulder pain EXAM: CT CERVICAL SPINE WITHOUT CONTRAST TECHNIQUE: Multidetector CT imaging of the cervical spine was performed without intravenous contrast. Multiplanar CT image reconstructions were also generated. COMPARISON:  None. FINDINGS: Alignment: Alignment is anatomic. Skull base and vertebrae: No acute displaced cervical spine fractures. Soft tissues and spinal canal: No prevertebral fluid or swelling. No visible canal hematoma. Disc levels: Multilevel spondylosis and facet hypertrophy greatest from C2 through C5. Upper chest: Displaced left posterior third rib fracture is partially visualized. There is adjacent soft tissue swelling. Please refer to left shoulder x-ray report. Other: Reconstructed images demonstrate no additional findings. IMPRESSION: 1. No acute cervical spine fracture. 2. Posterior left third rib fracture with associated chest wall soft tissue swelling. Please refer to left shoulder x-ray report. Electronically Signed   By: Randa Ngo M.D.   On: 05/28/2019 18:23   DG Shoulder Left  Result Date: 05/28/2019 CLINICAL DATA:  [place], left shoulder pain  EXAM: LEFT SHOULDER - 2+ VIEW COMPARISON:  None. FINDINGS: Frontal, transscapular, and axillary views of the left shoulder are obtained. There are displaced left posterior third and fourth rib fractures. I do not see any pneumothorax. There is irregular lucency in the infraspinous region the scapula suspicious for fracture. There is glenohumeral and acromioclavicular joint osteoarthritis. IMPRESSION: 1. Displaced left posterior third and fourth rib fractures without definite pneumothorax. 2. Scapular fracture involving the infraspinous region. No definite intra-articular extension. 3. Osteoarthritis. Electronically Signed   By: Sharlet Salina M.D.   On: 05/28/2019 18:25        Scheduled Meds: . aspirin EC  81 mg Oral Daily  . docusate sodium  100 mg Oral BID  . ferrous sulfate  325 mg Oral Q breakfast  . furosemide  10 mg Oral  Daily  . lisinopril  20 mg Oral Daily  . loratadine  10 mg Oral Daily  . methocarbamol  500 mg Oral QHS  . metoprolol succinate  25 mg Oral Daily  . mirtazapine  15 mg Oral QHS  . pantoprazole  40 mg Oral Daily  . pregabalin  75 mg Oral BID  . rosuvastatin  10 mg Oral Daily  . cyanocobalamin  2,000 mcg Oral Daily   Continuous Infusions:   LOS: 0 days    Time spent: 30 minutes    Lucas Winograd, MD [facility]   To contact the attending provider between 7A-7P or the covering provider during after hours 7P-7A, please log into the web site [email or web address] and access using universal [place] password for that web site. If you do not have the password, please call the hospital operator.  05/29/2019, 8:51 AM

## 2019-05-30 ENCOUNTER — Inpatient Hospital Stay: Payer: Medicare Other

## 2019-05-30 DIAGNOSIS — I951 Orthostatic hypotension: Secondary | ICD-10-CM

## 2019-05-30 LAB — CBC
HCT: 33.1 % — ABNORMAL LOW (ref 36.0–46.0)
Hemoglobin: 10.8 g/dL — ABNORMAL LOW (ref 12.0–15.0)
MCH: 28.9 pg (ref 26.0–34.0)
MCHC: 32.6 g/dL (ref 30.0–36.0)
MCV: 88.5 fL (ref 80.0–100.0)
Platelets: 106 10*3/uL — ABNORMAL LOW (ref 150–400)
RBC: 3.74 MIL/uL — ABNORMAL LOW (ref 3.87–5.11)
RDW: 14.5 % (ref 11.5–15.5)
WBC: 7.1 10*3/uL (ref 4.0–10.5)
nRBC: 0 % (ref 0.0–0.2)

## 2019-05-30 MED ORDER — CHLORHEXIDINE GLUCONATE CLOTH 2 % EX PADS
6.0000 | MEDICATED_PAD | Freq: Every day | CUTANEOUS | Status: DC
  Administered 2019-05-30 – 2019-05-31 (×2): 6 via TOPICAL

## 2019-05-30 MED ORDER — FENTANYL CITRATE (PF) 100 MCG/2ML IJ SOLN: 12.5000 ug | Freq: Four times a day (QID) | INTRAMUSCULAR | Status: DC | PRN

## 2019-05-30 MED ORDER — ONDANSETRON HCL 4 MG/2ML IJ SOLN
4.0000 mg | Freq: Four times a day (QID) | INTRAMUSCULAR | Status: DC | PRN
  Administered 2019-05-30: 4 mg via INTRAVENOUS
  Filled 2019-05-30: qty 2

## 2019-05-30 MED ORDER — SODIUM CHLORIDE 0.9 % IV SOLN: INTRAVENOUS | Status: AC

## 2019-05-30 NOTE — Progress Notes (Signed)
.trh  PROGRESS NOTE    Leah Contreras  VQX:[id number] DOB: Sep 30, 1931 DOA: 05/28/2019 PCP: Melonie Florida, FNP    Chief Complaint  Patient presents with  . Fall    Brief Narrative: 86 year old female resident of independent living with history of TIA, hypertension, IBS, hyperlipidemia, prediabetes presented with mechanical fall when she was taking a shuttle bus and lost her balance.  Reports she normally uses a walker at home.  Patient hit her head and thinks to me have lost her consciousness briefly.  Denies any dizziness, shortness of breath, loss of bowel or bladder control.  Has had a mechanical fall 5 months back and fractured few of her right ribs.  Patient also reported being unable to urinate since the morning of admission. In the ED patient was hypertensive.  CT of the head showed soft tissue swelling of the posterior scalp.  CT of the cervical spine negative for any injury.  CT of the chest showed posterior left third and fourth rib fracture.  X-ray of the left shoulder shows scapular fracture involving the infraspinous region.  X-ray of the lumbar spine showed acute L1 compression deformity less than 25% loss of height. Patient placed in observation for multiple fractures and pain control.      Assessment & Plan:   Principal Problem:   Fall, mechanical versus orthostatic Associated with multiple fractures involving left third and fourth rib, left scapular infraspinatus, compression deformity. Pain better controlled with scheduled Tylenol and IV Toradol.  As needed oxycodone. PT/OT recommends SNF.   Active Problems: Orthostatic hypotension Noted during PT evaluation this morning.  Will discontinue lisinopril, continue metoprolol.  Added IV fluids.  As needed Zofran for nausea.  Check orthostasis in a.m.  Left third and fourth posterior rib fracture Pain control as above.  Incentive spirometry.  Left scapular infraspinous fracture Pain control as above.  Orthopedics  consult appreciated and recommends nonweightbearing on the left upper extremity and use a hemiwalker with physical therapy.  Given minimally displaced scapular fracture, it should heal over the next 4-6 weeks and she would then need some rehabilitation of the rotator cuff.   Acute L1 compression deformity Pain control and therapy as above.  Kyphoplasty if no improvement.  Left shoulder swelling Head CT without other acute findings.  No neurological deficit.  Acute urinary retention No clear etiology.  Was found to have a >800 cc urine in the bladder.  Foley placed.   UA and renal ultrasound unremarkable.  Will attempt voiding trial in a.m.       DVT prophylaxis: Subcu Lovenox Code Status: Full code Family Communication: None Disposition:   Status is: Observation  The patient will require care spanning > 2 midnights and should be moved to inpatient because: Ongoing active pain requiring inpatient pain management.  Multiple fractures requiring IV scheduled pain medication.  Unsafe for discharge home and needs SNF per PT evaluation.  Dispo: The patient is from: Home              Anticipated d/c is to: SNF              Anticipated d/c date is: 1 day              Patient currently is not medically stable to d/c.       Consultants:   Orthopedics   Procedures: CT head, cervical spine, CT chest, renal ultrasound   Antimicrobials:    Subjective: Pain over the ribs and back slightly better from yesterday.  Still having difficulty with ambulation.  Reports being dizzy and nauseous.  Objective: Vitals:   05/30/19 0000 05/30/19 0410 05/30/19 0724 05/30/19 1603  BP: (!) 143/64 (!) 150/68 132/68 (!) 151/70  Pulse:  84 84 75  Resp:  16 18 16   Temp:  98.5 F (36.9 C) 98.5 F (36.9 C) 97.8 F (36.6 C)  TempSrc:  Oral Oral Oral  SpO2: 98% 90% 93% 91%  Weight:      Height:        Intake/Output Summary (Last 24 hours) at 05/30/2019 1725 Last data filed at 05/30/2019  1716 Gross per 24 hour  Intake 622.13 ml  Output 1300 ml  Net -677.87 ml   Filed Weights   05/28/19 1657  Weight: 74.8 kg   Physical exam Not in distress, fatigued HEENT: Moist mucosa Chest: Clear bilaterally, pain on minimal pressure over the left ribs CVs: Normal S1-S2 GI: Soft, nontender, nondistended, Foley + Musculoskeletal: Warm, no edema pain on pressure over the lumbar vertebrae     Data Reviewed: I have personally reviewed following labs and imaging studies  CBC: Recent Labs  Lab 05/28/19 1909 05/30/19 0404  WBC 12.8* 7.1  HGB 11.6* 10.8*  HCT 35.3* 33.1*  MCV 89.4 88.5  PLT 127* 106*    Basic Metabolic Panel: Recent Labs  Lab 05/28/19 1909  NA 140  K 4.4  CL 108  CO2 22  GLUCOSE 121*  BUN 20  CREATININE 1.05*  CALCIUM 10.0    GFR: Estimated Creatinine Clearance: 38.9 mL/min (A) (by C-G formula based on SCr of 1.05 mg/dL (H)).  Liver Function Tests: No results for input(s): AST, ALT, ALKPHOS, BILITOT, PROT, ALBUMIN in the last 168 hours.  CBG: No results for input(s): GLUCAP in the last 168 hours.   Recent Results (from the past 240 hour(s))  SARS Coronavirus 2 by RT PCR (hospital order, performed in [facility] hospital lab) Nasopharyngeal Nasopharyngeal Swab     Status: None   Collection Time: 05/28/19  8:03 PM   Specimen: Nasopharyngeal Swab  Result Value Ref Range Status   SARS Coronavirus 2 NEGATIVE NEGATIVE Final    Comment: (NOTE) SARS-CoV-2 target nucleic acids are NOT DETECTED. The SARS-CoV-2 RNA is generally detectable in upper and lower respiratory specimens during the acute phase of infection. The lowest concentration of SARS-CoV-2 viral copies this assay can detect is 250 copies / mL. A negative result does not preclude SARS-CoV-2 infection and should not be used as the sole basis for treatment or other patient management decisions.  A negative result may occur with improper specimen collection / handling, submission of  specimen other than nasopharyngeal swab, presence of viral mutation(s) within the areas targeted by this assay, and inadequate number of viral copies (<250 copies / mL). A negative result must be combined with clinical observations, patient history, and epidemiological information. Fact Sheet for Patients:   05/30/19 Fact Sheet for Healthcare Providers: BoilerBrush.com.cy This test is not yet approved or cleared  by the https://pope.com/ FDA and has been authorized for detection and/or diagnosis of SARS-CoV-2 by FDA under an Emergency Use Authorization (EUA).  This EUA will remain in effect (meaning this test can be used) for the duration of the COVID-19 declaration under Section 564(b)(1) of the Act, 21 U.S.C. section 360bbb-3(b)(1), unless the authorization is terminated or revoked sooner. Performed at [facility], [address] [place], [place]          Radiology Studies: DG Lumbar Spine Complete  Result Date: 05/28/2019 CLINICAL DATA:  Larey Seat backwards, lumbar spine pain EXAM: LUMBAR SPINE - COMPLETE 4+ VIEW COMPARISON:  None. FINDINGS: Frontal, bilateral oblique, and lateral views of the lumbar spine are obtained. There are 5 non-rib-bearing lumbar type vertebral bodies. There is an acute L1 compression deformity with fracture line seen through the anterior aspect of the vertebral body. Less than 25% loss of height. No retropulsion. There is disc space narrowing at L5/S1. Diffuse facet hypertrophy greatest at the lumbosacral junction. Sacroiliac joints are normal. IMPRESSION: 1. Acute L1 compression deformity with less than 25% loss of height and no retropulsion. 2. Lower lumbar spondylosis and facet hypertrophy. Electronically Signed   By: Sharlet Salina M.D.   On: 05/28/2019 18:26   CT Head Wo Contrast  Result Date: 05/28/2019 CLINICAL DATA:  Status post fall. EXAM: CT HEAD WITHOUT CONTRAST TECHNIQUE:  Contiguous axial images were obtained from the base of the skull through the vertex without intravenous contrast. COMPARISON:  None. FINDINGS: Brain: There is mild cerebral atrophy with widening of the extra-axial spaces and ventricular dilatation. There are areas of decreased attenuation within the white matter tracts of the supratentorial brain, consistent with microvascular disease changes. Mild bilateral basal ganglia calcification is seen, right greater than left. Vascular: No hyperdense vessel or unexpected calcification. Skull: Normal. Negative for fracture or focal lesion. Sinuses/Orbits: No acute finding. Other: Mild-to-moderate severity scalp soft tissue swelling is seen near the posterior aspect of the vertex on the left. IMPRESSION: 1. Mild-to-moderate severity scalp soft tissue swelling near the posterior aspect of the vertex on the left. 2. No acute intracranial abnormality. Electronically Signed   By: Aram Candela M.D.   On: 05/28/2019 18:26   CT Chest Wo Contrast  Result Date: 05/28/2019 CLINICAL DATA:  Status post fall. EXAM: CT CHEST WITHOUT CONTRAST TECHNIQUE: Multidetector CT imaging of the chest was performed following the standard protocol without IV contrast. COMPARISON:  None. FINDINGS: Cardiovascular: There is moderate severity calcification of the thoracic aorta. Normal heart size. No pericardial effusion. Mediastinum/Nodes: No enlarged mediastinal or axillary lymph nodes. Thyroid gland, trachea, and esophagus demonstrate no significant findings. Lungs/Pleura: Lungs are clear. No pleural effusion or pneumothorax. Upper Abdomen: Noninflamed diverticula are seen throughout the large bowel. Musculoskeletal: Acute fractures of the posterior third and fourth left ribs are seen. Chronic lateral fifth, sixth and seventh right rib fractures are noted. Moderate to marked severity multilevel degenerative changes seen throughout the thoracic spine. IMPRESSION: 1. Acute posterior third and  fourth left rib fractures. 2. Noninflamed diverticula throughout the large bowel. 3. Moderate to marked severity multilevel degenerative changes throughout the thoracic spine. Aortic Atherosclerosis (ICD10-I70.0). Electronically Signed   By: Aram Candela M.D.   On: 05/28/2019 19:27   CT Cervical Spine Wo Contrast  Result Date: 05/28/2019 CLINICAL DATA:  Larey Seat, back and left shoulder pain EXAM: CT CERVICAL SPINE WITHOUT CONTRAST TECHNIQUE: Multidetector CT imaging of the cervical spine was performed without intravenous contrast. Multiplanar CT image reconstructions were also generated. COMPARISON:  None. FINDINGS: Alignment: Alignment is anatomic. Skull base and vertebrae: No acute displaced cervical spine fractures. Soft tissues and spinal canal: No prevertebral fluid or swelling. No visible canal hematoma. Disc levels: Multilevel spondylosis and facet hypertrophy greatest from C2 through C5. Upper chest: Displaced left posterior third rib fracture is partially visualized. There is adjacent soft tissue swelling. Please refer to left shoulder x-ray report. Other: Reconstructed images demonstrate no additional findings. IMPRESSION: 1. No acute cervical spine fracture. 2. Posterior left third rib fracture  with associated chest wall soft tissue swelling. Please refer to left shoulder x-ray report. Electronically Signed   By: Randa Ngo M.D.   On: 05/28/2019 18:23   US RENAL  Result Date: 05/30/2019 CLINICAL DATA:  Urinary retention EXAM: RENAL / URINARY TRACT ULTRASOUND COMPLETE COMPARISON:  None. FINDINGS: Right Kidney: Renal measurements: 9.9 x 5.2 x 5.0 cm. = volume: 133 mL . Echogenicity within normal limits. No mass or hydronephrosis visualized. Left Kidney: Renal measurements: 9.9 x 4.8 x 4.5 cm. = volume: 104 mL. Echogenicity within normal limits. No mass or hydronephrosis visualized. Bladder: Decompressed by Foley catheter. Other: None. IMPRESSION: Normal appearing kidneys bilaterally.  Electronically Signed   By: Inez Catalina M.D.   On: 05/30/2019 11:55   DG Shoulder Left  Result Date: 05/28/2019 CLINICAL DATA:  [place], left shoulder pain EXAM: LEFT SHOULDER - 2+ VIEW COMPARISON:  None. FINDINGS: Frontal, transscapular, and axillary views of the left shoulder are obtained. There are displaced left posterior third and fourth rib fractures. I do not see any pneumothorax. There is irregular lucency in the infraspinous region the scapula suspicious for fracture. There is glenohumeral and acromioclavicular joint osteoarthritis. IMPRESSION: 1. Displaced left posterior third and fourth rib fractures without definite pneumothorax. 2. Scapular fracture involving the infraspinous region. No definite intra-articular extension. 3. Osteoarthritis. Electronically Signed   By: Randa Ngo M.D.   On: 05/28/2019 18:25        Scheduled Meds: . acetaminophen  650 mg Oral Q6H  . aspirin EC  81 mg Oral Daily  . Chlorhexidine Gluconate Cloth  6 each Topical Daily  . docusate sodium  100 mg Oral BID  . ferrous sulfate  325 mg Oral Q breakfast  . furosemide  10 mg Oral Daily  . ketorolac  15 mg Intravenous Q8H  . loratadine  10 mg Oral Daily  . methocarbamol  500 mg Oral QHS  . metoprolol succinate  25 mg Oral Daily  . mirtazapine  15 mg Oral QHS  . pantoprazole  40 mg Oral Daily  . pregabalin  75 mg Oral BID  . rosuvastatin  10 mg Oral Daily  . cyanocobalamin  2,000 mcg Oral Daily   Continuous Infusions: . sodium chloride 100 mL/hr at 05/30/19 1048     LOS: 1 day    Time spent: 35 minutes    Trung Wenzl, MD [facility]   To contact the attending provider between 7A-7P or the covering provider during after hours 7P-7A, please log into the web site [email or web address] and access using universal [place] password for that web site. If you do not have the password, please call the hospital operator.  05/30/2019, 5:25 PM

## 2019-05-30 NOTE — Progress Notes (Signed)
Patient had no further c/o's of N/V , prn zofran effective. IV fluids seem to have helped patient feel much better. Appetite fair. Sling applied to Left arm for support

## 2019-05-30 NOTE — Evaluation (Signed)
Occupational Therapy Evaluation Patient Details Name: Leah Contreras MRN: [id number] DOB: 1931/08/19 Today's Date: 05/30/2019    History of Present Illness Pt is an 86 y.o. female with medical history significant for TIA, hypertension, IBS constipation predominant, hyperlipidemia, fibromyalgia, GERD, prediabetes who presents after a mechanical fall. Workup showed posterior L third and 4th rib fx, acute L1 compression deformity with less than 25% loss of height, and L shoulder scapular rx involving infraspinous region.   Clinical Impression   Leah Contreras presents to OT with pain/ROM/strength deficits in LUE, LUE weight bearing precautions, and impaired balance that impact her ability to safely and independently complete functional tasks.  Prior to admission, pt lived alone at [facility] in a apartment that she could access via elevator.  She was independent in dressing, bathing, feeding, and grooming, and attended meals in the dining room.  Pt received therapy at [facility] after a fall in December and received assistance for medication management.  Currently, pt requires mod-max assist for ADLs including grooming, feeding, bathing, and dressing 2/2 non-weightbearing status on LUE.  OTR provided min assist for logrolling technique and mod assist for supine > sit to manage trunk and BLEs.  Pt endorsed high amount of pain and was orthostatic prior to OT eval, so deferred additional OOB mobility this date.  Per PT report, pt requires min assist-CGA for functional mobility using hemiwalker.  Leah Contreras will continue to benefit from skilled OT services in acute setting to address pain and ADLs while adhering to weight bearing precautions.  As pt will need significant support for ADLs, recommend SNF upon discharge.  Pt and pt's daughter report strong desire to not discharge to STR and state that they can provide support through additional CNA assistance at [facility].  OTR informed pt and  pt's daughter that therapy will continue to assess most appropriate discharge recommendation as pt makes progress towards goals, but SNF is most appropriate recommendation at this time.    Follow Up Recommendations  SNF    Equipment Recommendations       Recommendations for Other Services       Precautions / Restrictions Precautions Precautions: Fall Precaution Comments: orthostatic this AM per PT Restrictions Weight Bearing Restrictions: Yes LUE Weight Bearing: Non weight bearing      Mobility Bed Mobility Overal bed mobility: Needs Assistance Bed Mobility: Rolling;Sidelying to Sit Rolling: Min assist Sidelying to sit: Mod assist       General bed mobility comments: assistance for trunk and BLEs  Transfers Overall transfer level: Needs assistance Equipment used: Hemi-walker Transfers: Sit to/from Stand Sit to Stand: Min assist         General transfer comment: not tested, min assist per PT report    Balance Overall balance assessment: Needs assistance Sitting-balance support: Feet supported;Single extremity supported Sitting balance-Leahy Scale: Poor Sitting balance - Comments: Unable to use LUE for support     Standing balance-Leahy Scale: Poor Standing balance comment: not tested                           ADL either performed or assessed with clinical judgement   ADL Overall ADL's : Needs assistance/impaired Eating/Feeding: Moderate assistance;Sitting Eating/Feeding Details (indicate cue type and reason): assistance needed for two handed tasks Grooming: Moderate assistance Grooming Details (indicate cue type and reason): assistance needed for two handed tasks Upper Body Bathing: Moderate assistance;Sitting Upper Body Bathing Details (indicate cue type and reason): assistance needed  2/2 weight bearing precs     Upper Body Dressing : Maximal assistance Upper Body Dressing Details (indicate cue type and reason): assistance needed 2/2  weightbearing precautions   Lower Body Dressing Details (indicate cue type and reason): not tested, assistance likely needed 2/2 weightbearing precautions   Toilet Transfer Details (indicate cue type and reason): not tested           General ADL Comments: OOB mobility deferred 2/2 pt pain and orthostatic this AM.  Per PT report, pt required CGA for functional transfers and mobility with hemiwalker, but became orthostatic and lost balance.  Pt requires mod-max assist for dressing and bathing 2/2 weight bearing precautions in LUE.     Vision Baseline Vision/History: No visual deficits Patient Visual Report: No change from baseline       Perception     Praxis      Pertinent Vitals/Pain Pain Assessment: Faces Faces Pain Scale: Hurts even more Pain Location: L shoulder, low back Pain Descriptors / Indicators: Aching;Grimacing;Guarding;Moaning Pain Intervention(s): Limited activity within patient's tolerance;Monitored during session     Hand Dominance Right   Extremity/Trunk Assessment Upper Extremity Assessment Upper Extremity Assessment: LUE deficits/detail;RUE deficits/detail RUE Deficits / Details: WFLs LUE Deficits / Details: not assessed due to NWB LUE: Unable to fully assess due to pain   Lower Extremity Assessment Lower Extremity Assessment: Generalized weakness   Cervical / Trunk Assessment Cervical / Trunk Assessment: Normal   Communication Communication Communication: No difficulties   Cognition Arousal/Alertness: Awake/alert Behavior During Therapy: WFL for tasks assessed/performed Overall Cognitive Status: Within Functional Limits for tasks assessed                                 General Comments: Pt's daughter reports hx of dementia, but no cognitive deficits observed this date   General Comments       Exercises Other Exercises Other Exercises: orthostatic vitals assessed see flowsheet for details pt very symptomatic in standing  (delayed onset of symptoms ~30-45seconds) Other Exercises: provided education re: OT role and plan of care, fall and safety precautions, self care, pain management, bed mobility   Shoulder Instructions      Home Living Family/patient expects to be discharged to:: Private residence Living Arrangements: Alone Available Help at Discharge: Available PRN/intermittently Type of Home: Independent living facility([facility]) Home Access: Level entry;Elevator     Home Layout: One level     Bathroom Shower/Tub: Occupational psychologist: Handicapped height     Home Equipment: Grab bars - toilet;Grab bars - tub/shower;Walker - 4 wheels;Bedside commode;Shower seat;Hand held shower head   Additional Comments: Pt lives at [facility].  Has been working with PT and OT to rehab RUE after fall in December.  Pt has ability to have CNA assistance for dressing, bathing, bringing meals, etc.      Prior Functioning/Environment Level of Independence: Independent  Gait / Transfers Assistance Needed: Pt does not use AD for mobility ADL's / Homemaking Assistance Needed: Pt independent in dressing, bathing, feeding, and toileting.  Pt attends meals in dining room of facility.  Pt's facility manages medications.   Comments: Pt endorses one fall in December in which she broke RUE.        OT Problem List: Decreased strength;Decreased range of motion;Decreased activity tolerance;Impaired balance (sitting and/or standing);Decreased knowledge of use of DME or AE;Decreased knowledge of precautions;Pain;Impaired UE functional use      OT  Treatment/Interventions: Self-care/ADL training;Therapeutic exercise;Energy conservation;DME and/or AE instruction;Therapeutic activities;Patient/family education;Balance training    OT Goals(Current goals can be found in the care plan section) Acute Rehab OT Goals Patient Stated Goal: to decrease pain, return home OT Goal Formulation: With patient/family Time For  Goal Achievement: 06/13/19 Potential to Achieve Goals: Good  OT Frequency: Min 2X/week   Barriers to D/C:            Co-evaluation              AM-PAC OT "6 Clicks" Daily Activity     Outcome Measure Help from another person eating meals?: A Little Help from another person taking care of personal grooming?: A Little Help from another person toileting, which includes using toliet, bedpan, or urinal?: A Lot Help from another person bathing (including washing, rinsing, drying)?: A Lot Help from another person to put on and taking off regular upper body clothing?: A Lot Help from another person to put on and taking off regular lower body clothing?: A Lot 6 Click Score: 14   End of Session    Activity Tolerance: Patient limited by pain Patient left: in bed;with call bell/phone within reach;with bed alarm set  OT Visit Diagnosis: Other abnormalities of gait and mobility (R26.89);Muscle weakness (generalized) (M62.81);Pain Pain - Right/Left: Left Pain - part of body: Arm                Time: 3664-4034 OT Time Calculation (min): 26 min Charges:  OT General Charges $OT Visit: 1 Visit OT Evaluation $OT Eval Moderate Complexity: 1 Mod OT Treatments $Self Care/Home Management : 8-22 mins  Kathyrn Drown Hefner, OTR/L 05/30/19, 3:01 PM

## 2019-05-30 NOTE — Plan of Care (Signed)

## 2019-05-30 NOTE — TOC Progression Note (Signed)
Transition of Care [facility]) - Progression Note    Patient Details  Name: Leah Contreras MRN: [id number] Date of Birth: 06-20-1931  [facility] CM/SW Contact  Barrie Dunker, RN Phone Number: 05/30/2019, 4:25 PM  Clinical Narrative:    Sherron Monday with Tam[facility] at [facility] and they are accepting the patient for the bed request, I notified the patient and the family, plan to DC tomorrow   Expected Discharge Plan: Skilled Nursing Facility Barriers to Discharge: Continued Medical Work up  Expected Discharge Plan and Services Expected Discharge Plan: Skilled Nursing Facility   Discharge Planning Services: CM Consult   Living arrangements for the past 2 months: Independent Living Facility                                       Social Determinants of Health (SDOH) Interventions    Readmission Risk Interventions No flowsheet data found.

## 2019-05-30 NOTE — TOC Initial Note (Signed)
Transition of Care [facility]) - Initial/Assessment Note    Patient Details  Name: Leah Contreras MRN: [id number] Date of Birth: 1931-08-08  Transition of [facility]) CM/SW Contact:    Su Hilt, RN Phone Number: 05/30/2019, 2:16 PM  Clinical Narrative:                 Met with the patient to discuss DC plan and needs, her daughter is in the room and joined the discussion, She lives in independent living at [facility] and has PT, OT, aide  And speech if needed, It is recommended that she go to SNF for rehab, she had a fall in Dec and went to [facility] in [place] and is agreeable to go to [facility] in [place]. FL2, Passr and bed search completed  The patient has been fully covid vaccinated and her daughter has as well, [facility] allows 1 visitor as long as fully Vaccinated, I notified the patient  Expected Discharge Plan: [place] Barriers to Discharge: Continued Medical Work up   Patient Goals and CMS Choice Patient states their goals for this hospitalization and ongoing recovery are:: go to rehab for a short time then back home to [facility]      Expected Discharge Plan and Services Expected Discharge Plan: [place]   Discharge Planning Services: CM Consult   Living arrangements for the past 2 months: [place]                                      Prior Living Arrangements/Services Living arrangements for the past 2 months: [place] Lives with:: Self Patient language and need for interpreter reviewed:: Yes Do you feel safe going back to the place where you live?: Yes      Need for Family Participation in Patient Care: No (Comment) Care giver support system in place?: Yes (comment) Current home services: Home OT, Home PT, Homehealth aide, DME Criminal Activity/Legal Involvement Pertinent to Current Situation/Hospitalization: No - Comment as needed  Activities of Daily Living Home Assistive  Devices/Equipment: None ADL Screening (condition at time of admission) Patient's cognitive ability adequate to safely complete daily activities?: Yes Is the patient deaf or have difficulty hearing?: No Does the patient have difficulty seeing, even when wearing glasses/contacts?: No Does the patient have difficulty concentrating, remembering, or making decisions?: No Patient able to express need for assistance with ADLs?: Yes Does the patient have difficulty dressing or bathing?: No Independently performs ADLs?: Yes (appropriate for developmental age) Does the patient have difficulty walking or climbing stairs?: No Weakness of Legs: None Weakness of Arms/Hands: None  Permission Sought/Granted   Permission granted to share information with : Yes, Verbal Permission Granted              Emotional Assessment Appearance:: Appears stated age Attitude/Demeanor/Rapport: Engaged Affect (typically observed): Appropriate Orientation: : Oriented to Self, Oriented to Place, Oriented to Situation, Oriented to  Time Alcohol / Substance Use: Not Applicable Psych Involvement: No (comment)  Admission diagnosis:  Multiple fractures [T07.XXXA] Fall [W19.XXXA] Fall, initial encounter [id number]] Closed fracture of multiple ribs of left side, initial encounter [S22.42XA] Compression fracture of L1 vertebra, initial encounter ([place]) [S32.010A] Patient Active Problem List   Diagnosis Date Noted  . Rib fracture 05/29/2019  . Closed compression fracture of body of L1 vertebra ([place]) 05/29/2019  . Scalp hematoma, initial encounter 05/29/2019  . Scapular fracture 05/29/2019  .  History of TIA (transient ischemic attack) 05/29/2019  . Essential hypertension 05/29/2019  . HLD (hyperlipidemia) 05/29/2019  . Acute urinary retention 05/29/2019  . Multiple fractures 05/29/2019  . Fall 05/28/2019   PCP:  [name], [place] Pharmacy:   [place], [place] - [address] [place] [address] Phone: [phone] Fax: [phone]     Social Determinants of Health (SDOH) Interventions    Readmission Risk Interventions No flowsheet data found.

## 2019-05-30 NOTE — Evaluation (Signed)
Physical Therapy Evaluation Patient Details Name: Leah Contreras MRN: [id number] DOB: 09-Aug-1931 Today's Date: 05/30/2019   History of Present Illness  Pt is an 86 y.o. female with medical history significant for TIA, hypertension, IBS constipation predominant, hyperlipidemia, fibromyalgia, GERD, prediabetes who presents after a mechanical fall. Workup showed posterior L third and 4th rib fx, acute L1 compression deformity with less than 25% loss of height, and L shoulder scapular rx involving infraspinous region.    Clinical Impression  Pt alert, agreeable to PT with encouragement, fearful of falling. Pt initially exhibited very little pain signs/symptoms but with mobility and transfers pt exhibited mod pain signs/symptoms. Pt stated at baseline she is independent.  The patient performed rolling and sidelying to sit with minA and cues for technique. Sit <> stand performed twice with hemiwalker, minA for ea attempt including stabilizing initial balance. The patient was able to ambulated ~16ft in room with CGA and hemi-walker; just prior to turning to sit in chair, pt reported significant dizziness, began to stumble, max-totalA to sit in recliner safely. Orthostatic vitals assessed, pt + and symptomatic, further mobility deferred. Overall the patient demonstrated deficits (see "PT Problem List") that impede the patient's functional abilities, safety, and mobility and would benefit from skilled PT intervention. Recommendation is SNF due to current level of assistance needed and difficulty with functional mobility.     Follow Up Recommendations SNF    Equipment Recommendations  Other (comment)(hemi-walker)    Recommendations for Other Services OT consult     Precautions / Restrictions Precautions Precautions: Fall Precaution Comments: orthostatic Restrictions Weight Bearing Restrictions: Yes LUE Weight Bearing: Non weight bearing      Mobility  Bed Mobility Overal bed mobility: Needs  Assistance Bed Mobility: Rolling;Sidelying to Sit Rolling: Min assist Sidelying to sit: Min assist;HOB elevated          Transfers Overall transfer level: Needs assistance Equipment used: Hemi-walker Transfers: Sit to/from Stand Sit to Stand: Min assist            Ambulation/Gait Ambulation/Gait assistance: Min guard;Max assist Gait Distance (Feet): 15 Feet     Gait velocity: decreased   General Gait Details: Pt able to initially ambulate in room around bed to recliner; just prior to turning to sit in chair, pt reported significant dizziness, began to stumble, max-totalA to sit in recliner safely.  Stairs            Wheelchair Mobility    Modified Rankin (Stroke Patients Only)       Balance Overall balance assessment: Needs assistance Sitting-balance support: Feet supported Sitting balance-Leahy Scale: Poor       Standing balance-Leahy Scale: Poor                               Pertinent Vitals/Pain Pain Assessment: Faces Faces Pain Scale: Hurts even more Pain Location: L shoulder, low back Pain Descriptors / Indicators: Aching;Grimacing;Guarding;Moaning Pain Intervention(s): Limited activity within patient's tolerance;Monitored during session;Repositioned;Patient requesting pain meds-RN notified    Home Living Family/patient expects to be discharged to:: Private residence Living Arrangements: Alone Available Help at Discharge: Available PRN/intermittently Type of Home: Independent living facility Home Access: Level entry     Home Layout: One level Home Equipment: Grab bars - toilet;Grab bars - tub/shower;Walker - 4 wheels;Bedside commode;Shower seat Additional Comments: lives at [facility]    Prior Function Level of Independence: Independent with assistive device(s)         Comments:  no AD for ambulation     Hand Dominance   Dominant Hand: Right    Extremity/Trunk Assessment   Upper Extremity Assessment Upper  Extremity Assessment: RUE deficits/detail;LUE deficits/detail RUE Deficits / Details: WFLs LUE Deficits / Details: not assessed due to NWB LUE: Unable to fully assess due to pain    Lower Extremity Assessment Lower Extremity Assessment: Generalized weakness    Cervical / Trunk Assessment Cervical / Trunk Assessment: Normal  Communication   Communication: No difficulties  Cognition Arousal/Alertness: Awake/alert Behavior During Therapy: WFL for tasks assessed/performed Overall Cognitive Status: Within Functional Limits for tasks assessed                                        General Comments      Exercises Other Exercises Other Exercises: orthostatic vitals assessed see flowsheet for details pt very symptomatic in standing (delayed onset of symptoms ~30-45seconds)   Assessment/Plan    PT Assessment Patient needs continued PT services  PT Problem List Decreased strength;Decreased mobility;Decreased range of motion;Decreased knowledge of precautions;Decreased activity tolerance;Decreased balance;Pain;Decreased knowledge of use of DME       PT Treatment Interventions DME instruction;Therapeutic exercise;Gait training;Balance training;Stair training;Neuromuscular re-education;Functional mobility training;Therapeutic activities;Patient/family education    PT Goals (Current goals can be found in the Care Plan section)  Acute Rehab PT Goals Patient Stated Goal: to decrease pain PT Goal Formulation: With patient Time For Goal Achievement: 06/13/19 Potential to Achieve Goals: Good    Frequency 7X/week   Barriers to discharge Decreased caregiver support      Co-evaluation               AM-PAC PT "6 Clicks" Mobility  Outcome Measure Help needed turning from your back to your side while in a flat bed without using bedrails?: A Little Help needed moving from lying on your back to sitting on the side of a flat bed without using bedrails?: A Little Help  needed moving to and from a bed to a chair (including a wheelchair)?: A Little Help needed standing up from a chair using your arms (e.g., wheelchair or bedside chair)?: A Little Help needed to walk in hospital room?: A Little Help needed climbing 3-5 steps with a railing? : Total 6 Click Score: 16    End of Session Equipment Utilized During Treatment: Gait belt Activity Tolerance: Patient limited by pain;Other (comment)(orthostatic hypotension) Patient left: in chair;with call bell/phone within reach;with nursing/sitter in room;with SCD's reapplied Nurse Communication: Mobility status PT Visit Diagnosis: Other abnormalities of gait and mobility (R26.89);Difficulty in walking, not elsewhere classified (R26.2);Muscle weakness (generalized) (M62.81);Pain;Unsteadiness on feet (R26.81) Pain - Right/Left: Left Pain - part of body: Shoulder    Time: 0175-1025 PT Time Calculation (min) (ACUTE ONLY): 40 min   Charges:   PT Evaluation $PT Eval Moderate Complexity: 1 Mod PT Treatments $Therapeutic Exercise: 23-37 mins       Lieutenant Diego PT, DPT 2:40 PM,05/30/19

## 2019-05-31 DIAGNOSIS — S2242XD Multiple fractures of ribs, left side, subsequent encounter for fracture with routine healing: Secondary | ICD-10-CM

## 2019-05-31 LAB — BASIC METABOLIC PANEL
Anion gap: 7 (ref 5–15)
BUN: 26 mg/dL — ABNORMAL HIGH (ref 8–23)
CO2: 23 mmol/L (ref 22–32)
Calcium: 8.9 mg/dL (ref 8.9–10.3)
Chloride: 109 mmol/L (ref 98–111)
Creatinine, Ser: 1.1 mg/dL — ABNORMAL HIGH (ref 0.44–1.00)
GFR calc Af Amer: 53 mL/min — ABNORMAL LOW (ref >60)
GFR calc non Af Amer: 45 mL/min — ABNORMAL LOW (ref >60)
Glucose, Bld: 108 mg/dL — ABNORMAL HIGH (ref 70–99)
Potassium: 4.4 mmol/L (ref 3.5–5.1)
Sodium: 139 mmol/L (ref 135–145)

## 2019-05-31 LAB — SARS CORONAVIRUS 2 (TAT 6-24 HRS): SARS Coronavirus 2: NEGATIVE

## 2019-05-31 MED ORDER — ACETAMINOPHEN 325 MG PO TABS: 650.0000 mg | ORAL_TABLET | Freq: Four times a day (QID) | ORAL | 0 refills | Status: DC

## 2019-05-31 MED ORDER — OXYCODONE-ACETAMINOPHEN 5-325 MG PO TABS: 2.0000 | ORAL_TABLET | Freq: Four times a day (QID) | ORAL | 0 refills | Status: DC | PRN

## 2019-05-31 NOTE — Care Management Important Message (Signed)
Important Message  Patient Details  Name: Leah Contreras MRN: [id number] Date of Birth: 1931-06-29   Medicare Important Message Given:  Yes  Initial Medicare IM given by Patient Access Associate on 05/30/2019 at 11:21am.     Johnell Comings 05/31/2019, 8:53 AM

## 2019-05-31 NOTE — TOC Progression Note (Signed)
Transition of Care [facility]) - Progression Note    Patient Details  Name: Glendola Friedhoff MRN: [id number] Date of Birth: 12-22-1931  Transition of Care [facility]) CM/SW Contact  Barrie Dunker, RN Phone Number: 05/31/2019, 2:44 PM  Clinical Narrative:     Tammy with [facility] contacted me and let me know they do not have a bed today it will be tomorrow, the patient has not been inpatient for 3 nights  Expected Discharge Plan: Skilled Nursing Facility Barriers to Discharge: Continued Medical Work up  Expected Discharge Plan and Services Expected Discharge Plan: Skilled Nursing Facility   Discharge Planning Services: CM Consult   Living arrangements for the past 2 months: Independent Living Facility Expected Discharge Date: 05/31/19                                     Social Determinants of Health (SDOH) Interventions    Readmission Risk Interventions No flowsheet data found.

## 2019-05-31 NOTE — Progress Notes (Signed)
PT Cancellation Note  Patient Details Name: Leah Contreras MRN: [id number] DOB: 1931/10/13   Cancelled Treatment:    Reason Eval/Treat Not Completed: Other (comment)(Pt and family informed PT prior to arrival, pt had transferred to [facility] ~5 minutes ago. Pt stated she was significantly dizzy throughout. Politely declined PT due to these concerns and recent mobility, PT to follow up as able.)   Olga Coaster PT, DPT 12:20 PM,05/31/19

## 2019-05-31 NOTE — Progress Notes (Signed)
Physical Therapy Treatment Patient Details Name: Leah Contreras MRN: [id number] DOB: May 09, 1931 Today's Date: 05/31/2019    History of Present Illness Pt is an 86 y.o. female with medical history significant for TIA, hypertension, IBS constipation predominant, hyperlipidemia, fibromyalgia, GERD, prediabetes who presents after a mechanical fall. Workup showed posterior L third and 4th rib fx, acute L1 compression deformity with less than 25% loss of height, and L shoulder scapular rx involving infraspinous region.    PT Comments    Pt alert, agreeable to PT requested to use BSC. Orthostatic vitals assessed due to ongoing pt complaints of dizziness with mobility as well as pt presentation last session. See vitals flowsheet for details, elevated BP noted and RN notified. Pt performed supine to sit with minA to L. Sat EOB for several minutes with fair balance. Sit <> stand from EOB and from [facility], minA to achieve full standing position and initial standing balance deficits. Able to take several steps to/from commode but complained of increasing dizziness. Further mobility deferred. Returned to supine maxA(due to LUE NWB precautions), all needs in reach. The patient would benefit from further skilled PT intervention to progress towards goals. Recommendation remains appropriate.     Follow Up Recommendations  SNF     Equipment Recommendations  Other (comment);3in1 (PT)(hemiwalker)    Recommendations for Other Services OT consult     Precautions / Restrictions Precautions Precautions: Fall Precaution Comments: watch BP Restrictions Weight Bearing Restrictions: Yes LUE Weight Bearing: Non weight bearing    Mobility  Bed Mobility Overal bed mobility: Needs Assistance Bed Mobility: Supine to Sit;Sit to Supine     Supine to sit: Min assist;HOB elevated Sit to supine: Max assist;HOB elevated   General bed mobility comments: maxA to return to supine, difficulty due to transfer to L (LUE  NWB)  Transfers Overall transfer level: Needs assistance Equipment used: Hemi-walker Transfers: Sit to/from Stand Sit to Stand: Min assist            Ambulation/Gait Ambulation/Gait assistance: Min Web designer (Feet): 3 Feet Assistive device: Hemi-walker       General Gait Details: step to gait pattern; further mobility deferred due to pt dizziness   Stairs             Wheelchair Mobility    Modified Rankin (Stroke Patients Only)       Balance Overall balance assessment: Needs assistance Sitting-balance support: Feet supported;Single extremity supported Sitting balance-Leahy Scale: Fair Sitting balance - Comments: able to reach within base of support with fair balance     Standing balance-Leahy Scale: Poor                              Cognition Arousal/Alertness: Awake/alert Behavior During Therapy: WFL for tasks assessed/performed Overall Cognitive Status: Within Functional Limits for tasks assessed                                        Exercises Other Exercises Other Exercises: Patient able to transfer from EOB <> BSC, and then return to supine with maxA. Session mobility limited due to pt dizziness with all changes in position/prolonged standing Other Exercises: orthostatic vitals assessed, see flowsheet for details    General Comments        Pertinent Vitals/Pain Pain Assessment: Faces Faces Pain Scale: Hurts a little bit    Home Living  Prior Function            PT Goals (current goals can now be found in the care plan section) Progress towards PT goals: Progressing toward goals    Frequency    7X/week      PT Plan Current plan remains appropriate    Co-evaluation              AM-PAC PT "6 Clicks" Mobility   Outcome Measure  Help needed turning from your back to your side while in a flat bed without using bedrails?: A Little Help needed moving from  lying on your back to sitting on the side of a flat bed without using bedrails?: A Little Help needed moving to and from a bed to a chair (including a wheelchair)?: A Little Help needed standing up from a chair using your arms (e.g., wheelchair or bedside chair)?: A Little Help needed to walk in hospital room?: A Lot Help needed climbing 3-5 steps with a railing? : Total 6 Click Score: 15    End of Session Equipment Utilized During Treatment: Gait belt Activity Tolerance: Other (comment)(limited by dizziness) Patient left: in chair;with call bell/phone within reach;with nursing/sitter in room;with SCD's reapplied Nurse Communication: Mobility status PT Visit Diagnosis: Other abnormalities of gait and mobility (R26.89);Difficulty in walking, not elsewhere classified (R26.2);Muscle weakness (generalized) (M62.81);Pain;Unsteadiness on feet (R26.81) Pain - Right/Left: Left Pain - part of body: Shoulder     Time: 1450-1520 PT Time Calculation (min) (ACUTE ONLY): 30 min  Charges:  $Therapeutic Exercise: 23-37 mins                     Olga Coaster PT, DPT 3:50 PM,05/31/19

## 2019-05-31 NOTE — Discharge Summary (Signed)
Physician Discharge Summary  Leah Contreras ELF:[id number] DOB: 02-02-1931 DOA: 05/28/2019  PCP: Melonie Florida, FNP  Admit date: 05/28/2019 Discharge date: 05/31/2019  Admitted From: Home Disposition: SNF  Recommendations for Outpatient Follow-up:  1. Follow up with MD at SNF within 1 week. 2. Follow-up with orthopedics Dr. Rosita Kea in 4 weeks   Equipment/Devices: Hemiwalker  Discharge Condition: Fair CODE STATUS: Full code Diet recommendation: Heart Healthy   Discharge Diagnoses:  Principal Problem:   Multiple fractures  Active Problems:   Fall   Closed compression fracture of body of L1 vertebra (HCC)   Scalp hematoma, initial encounter   Scapular fracture   History of TIA (transient ischemic attack)   Essential hypertension   HLD (hyperlipidemia)   Acute urinary retention   Multiple fractures of ribs, left side, subsequent encounter for fracture with routine healing Orthostatic hypotension  Brief narrative/HPI 86 year old female resident of independent living with history of TIA, hypertension, IBS, hyperlipidemia, prediabetes presented with mechanical fall when she was taking a shuttle bus and lost her balance.  Reports she normally uses a walker at home.  Patient hit her head and thinks to me have lost her consciousness briefly.  Denies any dizziness, shortness of breath, loss of bowel or bladder control.  Has had a mechanical fall 5 months back and fractured few of her right ribs.  Patient also reported being unable to urinate since the morning of admission. In the ED patient was hypertensive.  CT of the head showed soft tissue swelling of the posterior scalp.  CT of the cervical spine negative for any injury.  CT of the chest showed posterior left third and fourth rib fracture.  X-ray of the left shoulder shows scapular fracture involving the infraspinous region.  X-ray of the lumbar spine showed acute L1 compression deformity less than 25% loss of height. Patient placed in  observation for multiple fractures and pain control.  Hospital course   Principal Problem:   Fall, mechanical versus orthostatic Associated with multiple fractures involving left third and fourth rib, left scapular infraspinatus, compression deformity. Seen by PT/OT and recommend SNF.  Pain better controlled with scheduled Tylenol and IV Toradol and as needed oxycodone.  We will discharge her on scheduled Tylenol and as needed Percocet.    Active Problems: Orthostatic hypotension Noted during PT evaluation on 5/27.  Lisinopril was held and given IV fluids with improvement.  Resume home blood pressure meds upon discharge.  Left third and fourth posterior rib fracture Pain control as above.  Incentive spirometry.  Left scapular infraspinous fracture Pain control as above.  Orthopedics consult appreciated and recommends nonweightbearing on the left upper extremity and use a hemiwalker with physical therapy.  Given minimally displaced scapular fracture, it should heal over the next 4-6 weeks and she would then need some rehabilitation of the rotator cuff.   Acute L1 compression deformity Pain control and therapy as above.  Kyphoplasty if no improvement.  Left shoulder swelling Head CT without other acute findings.  No neurological deficit.  Acute urinary retention No clear etiology.  Was found to have a >800 cc urine in the bladder.  Foley placed.   UA and renal ultrasound unremarkable.    Foley removed and successful voiding trial this morning.  Disposition: SNF Family communication: None  Consult: Orthopedics (Dr. Levie Heritage)       Discharge Instructions   Allergies as of 05/31/2019      Reactions   Codeine Sulfate [codeine]    Rash    Erythromycin  Rash    Tramadol       Medication List    TAKE these medications   acetaminophen 325 MG tablet Commonly known as: TYLENOL Take 2 tablets (650 mg total) by mouth every 6 (six) hours.   Aspirin 81 81 MG EC  tablet Generic drug: aspirin Take 81 mg by mouth daily. Swallow whole.   cetirizine 10 MG tablet Commonly known as: ZYRTEC Take 10 mg by mouth daily.   cyanocobalamin 2000 MCG tablet Take 2,000 mcg by mouth daily.   Diclofenac Sodium 3 % Gel Apply topically as needed.   docusate sodium 100 MG capsule Commonly known as: COLACE Take 100 mg by mouth 2 (two) times daily.   ferrous sulfate 325 (65 FE) MG tablet Take 325 mg by mouth daily with breakfast.   furosemide 20 MG tablet Commonly known as: Lasix Take 0.5 tablets (10 mg total) by mouth daily.   lidocaine 5 % Commonly known as: LIDODERM Place 1 patch onto the skin daily. Remove & Discard patch within 12 hours or as directed by MD   lisinopril 20 MG tablet Commonly known as: ZESTRIL Take 20 mg by mouth daily.   methocarbamol 500 MG tablet Commonly known as: ROBAXIN Take 500 mg by mouth at bedtime.   metoprolol succinate 25 MG 24 hr tablet Commonly known as: TOPROL-XL Take 25 mg by mouth daily.   mirtazapine 15 MG tablet Commonly known as: REMERON Take 15 mg by mouth at bedtime.   omeprazole 20 MG capsule Commonly known as: PRILOSEC Take 20 mg by mouth daily.   oxyCODONE-acetaminophen 5-325 MG tablet Commonly known as: PERCOCET/ROXICET Take 2 tablets by mouth every 6 (six) hours as needed for moderate pain.   pregabalin 75 MG capsule Commonly known as: LYRICA Take 75 mg by mouth 2 (two) times daily.   rosuvastatin 10 MG tablet Commonly known as: CRESTOR Take 10 mg by mouth daily.   senna 8.6 MG Tabs tablet Commonly known as: SENOKOT Take 2 tablets by mouth at bedtime as needed.   timolol 0.5 % ophthalmic solution Commonly known as: TIMOPTIC 1 drop 2 (two) times daily.      Contact information for after-discharge care    Destination    [facility] Preferred SNF Follow up in 1 week(s).   Service: Skilled Nursing Contact information: [address] [place]  [id number] [phone]             Allergies  Allergen Reactions  . Codeine Sulfate [Codeine]     Rash   . Erythromycin     Rash   . Tramadol         Procedures/Studies: DG Lumbar Spine Complete  Result Date: 05/28/2019 CLINICAL DATA:  Larey SeatFell backwards, lumbar spine pain EXAM: LUMBAR SPINE - COMPLETE 4+ VIEW COMPARISON:  None. FINDINGS: Frontal, bilateral oblique, and lateral views of the lumbar spine are obtained. There are 5 non-rib-bearing lumbar type vertebral bodies. There is an acute L1 compression deformity with fracture line seen through the anterior aspect of the vertebral body. Less than 25% loss of height. No retropulsion. There is disc space narrowing at L5/S1. Diffuse facet hypertrophy greatest at the lumbosacral junction. Sacroiliac joints are normal. IMPRESSION: 1. Acute L1 compression deformity with less than 25% loss of height and no retropulsion. 2. Lower lumbar spondylosis and facet hypertrophy. Electronically Signed   By: Sharlet SalinaMichael  Brown M.D.   On: 05/28/2019 18:26   CT Head Wo Contrast  Result Date: 05/28/2019 CLINICAL DATA:  Status post fall. EXAM: CT HEAD WITHOUT CONTRAST TECHNIQUE: Contiguous axial images were obtained from the base of the skull through the vertex without intravenous contrast. COMPARISON:  None. FINDINGS: Brain: There is mild cerebral atrophy with widening of the extra-axial spaces and ventricular dilatation. There are areas of decreased attenuation within the white matter tracts of the supratentorial brain, consistent with microvascular disease changes. Mild bilateral basal ganglia calcification is seen, right greater than left. Vascular: No hyperdense vessel or unexpected calcification. Skull: Normal. Negative for fracture or focal lesion. Sinuses/Orbits: No acute finding. Other: Mild-to-moderate severity scalp soft tissue swelling is seen near the posterior aspect of the vertex on the left. IMPRESSION: 1. Mild-to-moderate severity scalp soft tissue  swelling near the posterior aspect of the vertex on the left. 2. No acute intracranial abnormality. Electronically Signed   By: Aram Candela M.D.   On: 05/28/2019 18:26   CT Chest Wo Contrast  Result Date: 05/28/2019 CLINICAL DATA:  Status post fall. EXAM: CT CHEST WITHOUT CONTRAST TECHNIQUE: Multidetector CT imaging of the chest was performed following the standard protocol without IV contrast. COMPARISON:  None. FINDINGS: Cardiovascular: There is moderate severity calcification of the thoracic aorta. Normal heart size. No pericardial effusion. Mediastinum/Nodes: No enlarged mediastinal or axillary lymph nodes. Thyroid gland, trachea, and esophagus demonstrate no significant findings. Lungs/Pleura: Lungs are clear. No pleural effusion or pneumothorax. Upper Abdomen: Noninflamed diverticula are seen throughout the large bowel. Musculoskeletal: Acute fractures of the posterior third and fourth left ribs are seen. Chronic lateral fifth, sixth and seventh right rib fractures are noted. Moderate to marked severity multilevel degenerative changes seen throughout the thoracic spine. IMPRESSION: 1. Acute posterior third and fourth left rib fractures. 2. Noninflamed diverticula throughout the large bowel. 3. Moderate to marked severity multilevel degenerative changes throughout the thoracic spine. Aortic Atherosclerosis (ICD10-I70.0). Electronically Signed   By: Aram Candela M.D.   On: 05/28/2019 19:27   CT Cervical Spine Wo Contrast  Result Date: 05/28/2019 CLINICAL DATA:  Larey Seat, back and left shoulder pain EXAM: CT CERVICAL SPINE WITHOUT CONTRAST TECHNIQUE: Multidetector CT imaging of the cervical spine was performed without intravenous contrast. Multiplanar CT image reconstructions were also generated. COMPARISON:  None. FINDINGS: Alignment: Alignment is anatomic. Skull base and vertebrae: No acute displaced cervical spine fractures. Soft tissues and spinal canal: No prevertebral fluid or swelling. No  visible canal hematoma. Disc levels: Multilevel spondylosis and facet hypertrophy greatest from C2 through C5. Upper chest: Displaced left posterior third rib fracture is partially visualized. There is adjacent soft tissue swelling. Please refer to left shoulder x-ray report. Other: Reconstructed images demonstrate no additional findings. IMPRESSION: 1. No acute cervical spine fracture. 2. Posterior left third rib fracture with associated chest wall soft tissue swelling. Please refer to left shoulder x-ray report. Electronically Signed   By: Sharlet Salina M.D.   On: 05/28/2019 18:23   US RENAL  Result Date: 05/30/2019 CLINICAL DATA:  Urinary retention EXAM: RENAL / URINARY TRACT ULTRASOUND COMPLETE COMPARISON:  None. FINDINGS: Right Kidney: Renal measurements: 9.9 x 5.2 x 5.0 cm. = volume: 133 mL . Echogenicity within normal limits. No mass or hydronephrosis visualized. Left Kidney: Renal measurements: 9.9 x 4.8 x 4.5 cm. = volume: 104 mL. Echogenicity within normal limits. No mass or hydronephrosis visualized. Bladder: Decompressed by Foley catheter. Other: None. IMPRESSION: Normal appearing kidneys bilaterally. Electronically Signed   By: Alcide Clever M.D.   On: 05/30/2019 11:55   NM Myocar Multi W/Spect W/Wall Motion / EF  Result Date:  05/22/2019  There was no ST segment deviation noted during stress.  No T wave inversion was noted during stress.  There is a small reversible perfusion defect of mild severity present in the apical anterior location suggesting ischemia. wall motion is normal in this area  This is a low risk study.  The left ventricular ejection fraction is hyperdynamic (>65%).    DG Shoulder Left  Result Date: 05/28/2019 CLINICAL DATA:  Larey Seat, left shoulder pain EXAM: LEFT SHOULDER - 2+ VIEW COMPARISON:  None. FINDINGS: Frontal, transscapular, and axillary views of the left shoulder are obtained. There are displaced left posterior third and fourth rib fractures. I do not see any  pneumothorax. There is irregular lucency in the infraspinous region the scapula suspicious for fracture. There is glenohumeral and acromioclavicular joint osteoarthritis. IMPRESSION: 1. Displaced left posterior third and fourth rib fractures without definite pneumothorax. 2. Scapular fracture involving the infraspinous region. No definite intra-articular extension. 3. Osteoarthritis. Electronically Signed   By: Sharlet Salina M.D.   On: 05/28/2019 18:25       Subjective: Seen and examined.  Foley removed and able to void without difficulty.  Pain better controlled on current regimen.  Discharge Exam: Vitals:   05/31/19 0019 05/31/19 0812  BP: (!) 162/75 139/75  Pulse: 89 78  Resp: 18 16  Temp: 98.6 F (37 C) 98 F (36.7 C)  SpO2: 92% 93%   Vitals:   05/30/19 0724 05/30/19 1603 05/31/19 0019 05/31/19 0812  BP: 132/68 (!) 151/70 (!) 162/75 139/75  Pulse: 84 75 89 78  Resp: 18 16 18 16   Temp: 98.5 F (36.9 C) 97.8 F (36.6 C) 98.6 F (37 C) 98 F (36.7 C)  TempSrc: Oral Oral Oral Oral  SpO2: 93% 91% 92% 93%  Weight:      Height:        General: Elderly female not in distress HEENT: Moist mucosa, supple neck Chest: Clear bilaterally CVs: Normal S1-S2 GI: Soft, nondistended, nontender Musculoskeletal: Warm, no edema, limited mobility of the right shoulder and back with sling in place     The results of significant diagnostics from this hospitalization (including imaging, microbiology, ancillary and laboratory) are listed below for reference.     Microbiology: Recent Results (from the past 240 hour(s))  SARS Coronavirus 2 by RT PCR (hospital order, performed in [facility] hospital lab) Nasopharyngeal Nasopharyngeal Swab     Status: None   Collection Time: 05/28/19  8:03 PM   Specimen: Nasopharyngeal Swab  Result Value Ref Range Status   SARS Coronavirus 2 NEGATIVE NEGATIVE Final    Comment: (NOTE) SARS-CoV-2 target nucleic acids are NOT DETECTED. The SARS-CoV-2  RNA is generally detectable in upper and lower respiratory specimens during the acute phase of infection. The lowest concentration of SARS-CoV-2 viral copies this assay can detect is 250 copies / mL. A negative result does not preclude SARS-CoV-2 infection and should not be used as the sole basis for treatment or other patient management decisions.  A negative result may occur with improper specimen collection / handling, submission of specimen other than nasopharyngeal swab, presence of viral mutation(s) within the areas targeted by this assay, and inadequate number of viral copies (<250 copies / mL). A negative result must be combined with clinical observations, patient history, and epidemiological information. Fact Sheet for Patients:   05/30/19 Fact Sheet for Healthcare Providers: BoilerBrush.com.cy This test is not yet approved or cleared  by the https://pope.com/ FDA and has been authorized for detection and/or  diagnosis of SARS-CoV-2 by FDA under an Emergency Use Authorization (EUA).  This EUA will remain in effect (meaning this test can be used) for the duration of the COVID-19 declaration under Section 564(b)(1) of the Act, 21 U.S.C. section 360bbb-3(b)(1), unless the authorization is terminated or revoked sooner. Performed at [facility], [address] [place],  [address]      Labs: BNP (last 3 results) No results for input(s): BNP in the last 8760 hours. Basic Metabolic Panel: Recent Labs  Lab 05/28/19 1909 05/31/19 0352  NA 140 139  K 4.4 4.4  CL 108 109  CO2 22 23  GLUCOSE 121* 108*  BUN 20 26*  CREATININE 1.05* 1.10*  CALCIUM 10.0 8.9   Liver Function Tests: No results for input(s): AST, ALT, ALKPHOS, BILITOT, PROT, ALBUMIN in the last 168 hours. No results for input(s): LIPASE, AMYLASE in the last 168 hours. No results for input(s): AMMONIA in the last 168 hours. CBC: Recent Labs   Lab 05/28/19 1909 05/30/19 0404  WBC 12.8* 7.1  HGB 11.6* 10.8*  HCT 35.3* 33.1*  MCV 89.4 88.5  PLT 127* 106*   Cardiac Enzymes: No results for input(s): CKTOTAL, CKMB, CKMBINDEX, TROPONINI in the last 168 hours. BNP: Invalid input(s): POCBNP CBG: No results for input(s): GLUCAP in the last 168 hours. D-Dimer No results for input(s): DDIMER in the last 72 hours. Hgb A1c No results for input(s): HGBA1C in the last 72 hours. Lipid Profile No results for input(s): CHOL, HDL, LDLCALC, TRIG, CHOLHDL, LDLDIRECT in the last 72 hours. Thyroid function studies No results for input(s): TSH, T4TOTAL, T3FREE, THYROIDAB in the last 72 hours.  Invalid input(s): FREET3 Anemia work up No results for input(s): VITAMINB12, FOLATE, FERRITIN, TIBC, IRON, RETICCTPCT in the last 72 hours. Urinalysis    Component Value Date/Time   COLORURINE YELLOW (A) 05/28/2019 2253   APPEARANCEUR CLEAR (A) 05/28/2019 2253   LABSPEC 1.010 05/28/2019 2253   PHURINE 7.0 05/28/2019 2253   GLUCOSEU NEGATIVE 05/28/2019 2253   HGBUR NEGATIVE 05/28/2019 2253   BILIRUBINUR NEGATIVE 05/28/2019 2253   KETONESUR NEGATIVE 05/28/2019 2253   PROTEINUR NEGATIVE 05/28/2019 2253   NITRITE NEGATIVE 05/28/2019 2253   LEUKOCYTESUR NEGATIVE 05/28/2019 2253   Sepsis Labs Invalid input(s): PROCALCITONIN,  WBC,  LACTICIDVEN Microbiology Recent Results (from the past 240 hour(s))  SARS Coronavirus 2 by RT PCR (hospital order, performed in [facility] hospital lab) Nasopharyngeal Nasopharyngeal Swab     Status: None   Collection Time: 05/28/19  8:03 PM   Specimen: Nasopharyngeal Swab  Result Value Ref Range Status   SARS Coronavirus 2 NEGATIVE NEGATIVE Final    Comment: (NOTE) SARS-CoV-2 target nucleic acids are NOT DETECTED. The SARS-CoV-2 RNA is generally detectable in upper and lower respiratory specimens during the acute phase of infection. The lowest concentration of SARS-CoV-2 viral copies this assay can detect is  250 copies / mL. A negative result does not preclude SARS-CoV-2 infection and should not be used as the sole basis for treatment or other patient management decisions.  A negative result may occur with improper specimen collection / handling, submission of specimen other than nasopharyngeal swab, presence of viral mutation(s) within the areas targeted by this assay, and inadequate number of viral copies (<250 copies / mL). A negative result must be combined with clinical observations, patient history, and epidemiological information. Fact Sheet for Patients:   StrictlyIdeas.no Fact Sheet for Healthcare Providers: BankingDealers.co.za This test is not yet approved or cleared  by the [place]  [place] FDA and has been authorized for detection and/or diagnosis of SARS-CoV-2 by FDA under an Emergency Use Authorization (EUA).  This EUA will remain in effect (meaning this test can be used) for the duration of the COVID-19 declaration under Section 564(b)(1) of the Act, 21 U.S.C. section 360bbb-3(b)(1), unless the authorization is terminated or revoked sooner. Performed at [facility], [address], [place], [place] [address]      Time coordinating discharge: 35 minutes  SIGNED:   Eddie North, MD  [facility] 05/31/2019, 1:06 PM Pager   If 7PM-7AM, please contact night-coverage [email or web address] Password [email or web address]

## 2019-05-31 NOTE — Discharge Instructions (Signed)
Rib Fracture  A rib fracture is a break or crack in one of the bones of the ribs. The ribs are like a cage that goes around your upper chest. A broken or cracked rib is often painful, but most do not cause other problems. Most rib fractures usually heal on their own in 1-3 months. Follow these instructions at home: Managing pain, stiffness, and swelling  If directed, apply ice to the injured area. ? Put ice in a plastic bag. ? Place a towel between your skin and the bag. ? Leave the ice on for 20 minutes, 2-3 times a day.  Take over-the-counter and prescription medicines only as told by your doctor. Activity  Avoid activities that cause pain to the injured area. Protect your injured area.  Slowly increase activity as told by your doctor. General instructions  Do deep breathing as told by your doctor. You may be told to: ? Take deep breaths many times a day. ? Cough many times a day while hugging a pillow. ? Use a device (incentive spirometer) to do deep breathing many times a day.  Drink enough fluid to keep your pee (urine) clear or pale yellow.  Do not wear a rib belt or binder. These do not allow you to breathe deeply.  Keep all follow-up visits as told by your doctor. This is important. Contact a doctor if:  You have a fever. Get help right away if:  You have trouble breathing.  You are short of breath.  You cannot stop coughing.  You cough up thick or bloody spit (sputum).  You feel sick to your stomach (nauseous), throw up (vomit), or have belly (abdominal) pain.  Your pain gets worse and medicine does not help. Summary  A rib fracture is a break or crack in one of the bones of the ribs.  Apply ice to the injured area and take medicines for pain as told by your doctor.  Take deep breaths and cough many times a day. Hug a pillow every time you cough. This information is not intended to replace advice given to you by your health care provider. Make sure you  discuss any questions you have with your health care provider. Document Revised: 12/02/2016 Document Reviewed: 03/22/2016 Elsevier Patient Education  2020 [employer]   Lumbar Spine Fracture A lumbar spine fracture is a break in one of the bones of the lower back. Lumbar spine fractures can vary from mild to severe. The most severe types are those that:  Cause the broken bones to move out of place (unstable).  Injure or press on the spinal cord. During recovery, it is normal to have pain and stiffness in the lower back for weeks. What are the causes? This condition may be caused by:  A fall.  A car accident.  A gunshot wound.  A hard, direct hit to the back. What increases the risk? You are more likely to develop this condition if:  You are in a situation that could result in a fall or other violent injury.  You have a condition that causes weakness in the bones (osteoporosis). What are the signs or symptoms? The main symptom of this condition is severe pain in the lower back. If a fracture is complex or severe, there may also be:  A misshapen or swollen area on the lower back.  Limited ability to move an area of the lower back.  Inability to empty the bladder (urinary retention).  Loss of bowel or bladder control (  incontinence).  Loss of strength or sensation in the legs, feet, and toes.  Inability to move (paralysis). How is this diagnosed? This condition is diagnosed based on:  A physical exam.  Symptoms and what happened just before they developed.  The results of imaging tests, such as an X-ray, CT scan, or MRI. If your nerves have been damaged, you may also have other tests to find out the extent of the damage. How is this treated? Treatment for this condition depends on how severe the injury is. Most fractures can be treated with:  A back brace.  Bed rest and activity restrictions.  Pain medicine.  Physical therapy. Fractures that are complex,  involve multiple bones, or make the spine unstable may require surgery. Surgery is done:  To remove pressure from the nerves or spinal cord.  To stabilize the broken pieces of bone. Follow these instructions at home: Medicines  Take over-the-counter and prescription medicines only as told by your health care provider.  Do not drive or use heavy machinery while taking prescription pain medicine.  If you are taking prescription pain medicine, take actions to prevent or treat constipation. Your health care provider may recommend that you: ? Drink enough fluid to keep your urine pale yellow. ? Eat foods that are high in fiber, such as fresh fruits and vegetables, whole grains, and beans. ? Limit foods that are high in fat and processed sugars, such as fried or sweet foods. ? Take an over-the-counter or prescription medicine for constipation. If you have a brace:  Wear the back brace as told by your health care provider. Remove it only as told by your health care provider.  Keep the brace clean.  If the brace is not waterproof: ? Do not let it get wet. ? Cover it with a watertight covering when you take a bath or a shower. Activity  Stay in bed (on bed rest) only as directed by your health care provider.  Do exercises to improve motion and strength in your back (physical therapy), if your health care provider tells you to do so.  Return to your normal activities as directed by your health care provider. Ask your health care provider what activities are safe for you. Managing pain, stiffness, and swelling   If directed, put ice on the injured area: ? If you have a removable brace, remove it as told by your health care provider. ? Put ice in a plastic bag. ? Place a towel between your skin and the bag. ? Leave the ice on for 20 minutes, 2-3 times a day. General instructions  Do not use any products that contain nicotine or tobacco, such as cigarettes and e-cigarettes. These can  delay healing after injury. If you need help quitting, ask your health care provider.  Do not drink alcohol. Alcohol can interfere with your treatment.  Keep all follow-up visits as directed by your health care provider. This is important. ? Failing to follow up as recommended could result in permanent injury, disability, or long-lasting (chronic) pain. Contact a health care provider if:  You have a fever.  Your pain medicine is not helping.  Your pain does not get better over time.  You cannot return to your normal activities as planned or expected. Get help right away if:  You have difficulty breathing.  Your pain is very bad and it suddenly gets worse.  You have numbness, tingling, or weakness in any part of your body.  You are unable to empty  your bladder.  You cannot control your bladder or bowels.  You are unable to move any body part (paralysis) that is below the level of your injury.  You vomit.  You have pain in your abdomen. Summary  A lumbar spine fracture is a break in one of the bones of the lower back.  The main symptom of this condition is severe pain in the lower back. If a fracture is complex, there may also be numbness, tingling, or paralysis in the legs.  Treatment depends on how severe the injury is. Most fractures can be treated with a back brace, bed rest and activity restrictions, pain medicine, and physical therapy.  Fractures that are complex, involve multiple bones, or make the spine unstable may require surgery. This information is not intended to replace advice given to you by your health care provider. Make sure you discuss any questions you have with your health care provider. Document Revised: 02/04/2017 Document Reviewed: 02/04/2017 Elsevier Patient Education  2020 [employer]   Rib Fracture  A rib fracture is a break or crack in one of the bones of the ribs. The ribs are long, curved bones that wrap around your chest and attach to  your spine and your breastbone. The ribs protect your heart, lungs, and other organs in the chest. A broken or cracked rib is often painful but is not usually serious. Most rib fractures heal on their own over time. However, rib fractures can be more serious if multiple ribs are broken or if broken ribs move out of place and push against other structures or organs. What are the causes? This condition is caused by:  Repetitive movements with high force, such as pitching a baseball or having severe coughing spells.  A direct blow to the chest, such as a sports injury, a car accident, or a fall.  Cancer that has spread to the bones, which can weaken bones and cause them to break. What are the signs or symptoms? Symptoms of this condition include:  Pain when you breathe in or cough.  Pain when someone presses on the injured area.  Feeling short of breath. How is this diagnosed? This condition is diagnosed with a physical exam and medical history. Imaging tests may also be done, such as:  Chest X-ray.  CT scan.  MRI.  Bone scan.  Chest ultrasound. How is this treated? Treatment for this condition depends on the severity of the fracture. Most rib fractures usually heal on their own in 1-3 months. Sometimes healing takes longer if there is a cough that does not stop or if there are other activities that make the injury worse (aggravating factors). While you heal, you will be given medicines to control the pain. You will also be taught deep breathing exercises. Severe injuries may require hospitalization or surgery. Follow these instructions at home: Managing pain, stiffness, and swelling  If directed, apply ice to the injured area. ? Put ice in a plastic bag. ? Place a towel between your skin and the bag. ? Leave the ice on for 20 minutes, 2-3 times a day.  Take over-the-counter and prescription medicines only as told by your health care provider. Activity  Avoid a lot of  activity and any activities or movements that cause pain. Be careful during activities and avoid bumping the injured rib.  Slowly increase your activity as told by your health care provider. General instructions  Do deep breathing exercises as told by your health care provider. This helps prevent  pneumonia, which is a common complication of a broken rib. Your health care provider may instruct you to: ? Take deep breaths several times a day. ? Try to cough several times a day, holding a pillow against the injured area. ? Use a device called incentive spirometer to practice deep breathing several times a day.  Drink enough fluid to keep your urine pale yellow.  Do not wear a rib belt or binder. These restrict breathing, which can lead to pneumonia.  Keep all follow-up visits as told by your health care provider. This is important. Contact a health care provider if:  You have a fever. Get help right away if:  You have difficulty breathing or you are short of breath.  You develop a cough that does not stop, or you cough up thick or bloody sputum.  You have nausea, vomiting, or pain in your abdomen.  Your pain gets worse and medicine does not help. Summary  A rib fracture is a break or crack in one of the bones of the ribs.  A broken or cracked rib is often painful but is not usually serious.  Most rib fractures heal on their own over time.  Treatment for this condition depends on the severity of the fracture.  Avoid a lot of activity and any activities or movements that cause pain. This information is not intended to replace advice given to you by your health care provider. Make sure you discuss any questions you have with your health care provider. Document Revised: 12/02/2016 Document Reviewed: 03/21/2016 Elsevier Patient Education  2020 [employer].

## 2019-05-31 NOTE — Plan of Care (Signed)

## 2019-06-01 DIAGNOSIS — T07XXXA Unspecified multiple injuries, initial encounter: Secondary | ICD-10-CM

## 2019-06-01 MED ORDER — BISACODYL 10 MG RE SUPP
10.0000 mg | Freq: Once | RECTAL | Status: AC
  Administered 2019-06-01: 10 mg via RECTAL
  Filled 2019-06-01: qty 1

## 2019-06-01 MED ORDER — METOPROLOL SUCCINATE ER 50 MG PO TB24: 50.0000 mg | ORAL_TABLET | Freq: Every day | ORAL | Status: DC

## 2019-06-01 MED ORDER — DOCUSATE SODIUM 100 MG PO CAPS
200.0000 mg | ORAL_CAPSULE | Freq: Once | ORAL | Status: AC
  Administered 2019-06-01: 200 mg via ORAL
  Filled 2019-06-01: qty 2

## 2019-06-01 MED ORDER — METOPROLOL SUCCINATE ER 50 MG PO TB24: 50.0000 mg | ORAL_TABLET | Freq: Every day | ORAL | 0 refills | Status: DC

## 2019-06-01 NOTE — Discharge Summary (Signed)
Leah Contreras PFX:[id number] DOB: 1931/06/26 DOA: 05/28/2019 PCP: Melonie Florida, FNP  Admit date: 05/28/2019 Discharge date: 06/01/2019 Admitted From: Home  Disposition: SNF  Recommendations for Outpatient Follow-up:   1.Follow up with MD at SNF within 1 week. 2.Follow-up with orthopedics Dr. Rosita Kea in 4 weeks Discharge to SNF with PT/OT. Equipment/Devices: Hemiwalker Pt is to wear TLSO brace NWB status to left upper extremity.  Discharge Condition: Fair  CODE STATUS: Full code  Diet recommendation: Heart Healthy  Discharge Diagnoses:   Principal Problem:    Multiple fractures  Active Problems:    Fall   Closed compression fracture of body of L1 vertebra (HCC)   Scalp hematoma, initial encounter   Scapular fracture   History of TIA (transient ischemic attack)   Essential hypertension   HLD (hyperlipidemia)   Acute urinary retention Constipation   Multiple fractures of ribs, left side, subsequent encounter for fracture with routine healing Orthostatic hypotension  Brief narrative/HPI  86 year old female resident of independent living with history of TIA, hypertension, IBS, hyperlipidemia, prediabetes presented with mechanical fall when she was taking a shuttle bus and lost her balance.  Reports she normally uses a walker at home.  Patient hit her head and thinks to me have lost her consciousness briefly.  Denies any dizziness, shortness of breath, loss of bowel or bladder control.  Has had a mechanical fall 5 months back and fractured few of her right ribs.  Patient also reported being unable to urinate since the morning of admission.  In the ED patient was hypertensive.  CT of the head showed soft tissue swelling of the posterior scalp.  CT of the cervical spine negative for any injury.  CT of the chest showed posterior left third and fourth rib fracture.  X-ray of the left shoulder shows scapular fracture involving the infraspinous region.  X-ray of the lumbar spine  showed acute L1 compression deformity less than 25% loss of height.  The patient was evaluated by orthopedic surgery. They have recommended NWB status for the left upper extremity and use a hemiwalker for physical therapy. The minimally displaced scapular fracture, it should heal over the next 4-6 weeks. Ongoing PT will be required for rehabilitation of her rotator cuff. Pain control and PT was recommended for Acute L1 compression deformity as well as fitting the patient with a TLSO brace.   Foley catheter has been removed, and voiding trial has been ordered. Pt has voided on her own.  The patient has had a BM today.  Recommendation of PT/OT is for discharge to SNF. She will be discharged to SNF today in fair condition.   Disposition: SNF  Family communication: [name] at bedside during visit this morning. Consult: Orthopedics (Dr. Levie Heritage)  Discharge Instructions 1.Follow up with MD at SNF within 1 week. 2.Follow-up with orthopedics Dr. Rosita Kea in 4 weeks Discharge to SNF with PT/OT. Equipment/Devices: Hemiwalker Pt is to wear TLSO brace NWB status to left upper extremity.  Allergies as of 05/31/2019: Codeine Sulfate [codeine]/Rash, Erythromycin/Rash, Tramadol/ Unknown.  Medication List   TAKE these medications:  1)acetaminophen 325 MG tablet Commonly known as: TYLENOL Take 2 tablets (650 mg total) by mouth every 6 (six) hours.  2)Aspirin 81 81 MG EC tablet Generic drug: aspirin Take 81 mg by mouth daily. Swallow whole.  3)Cetirizine 10 MG tablet Commonly known as: ZYRTEC Take 10 mg by mouth daily.  4)cyanocobalamin 2000 MCG tablet Take 2,000 mcg by mouth daily.   5)Diclofenac Sodium 3 % Gel Apply topically as  needed.   6)Docusate sodium 100 MG capsule Commonly known as: COLACE Take 100 mg by mouth 2 (two) times daily.   7)Ferrous sulfate 325 (65 FE) MG tablet Take 325 mg by mouth daily with breakfast.   8) Furosemide 20 MG tablet Commonly known as: Lasix Take 0.5  tablets (10 mg total) by mouth daily.  9) Lidocaine 5 % Commonly known as: LIDODERM Place 1 patch onto the skin daily. Remove & Discard patch within 12 hours or as directed by MD  10) Lisinopril 20 MG tablet Commonly known as: ZESTRIL Take 20 mg by mouth daily.  11)Methocarbamol 500 MG tablet Commonly known as: ROBAXIN Take 500 mg by mouth at bedtime.  12)Metoprolol succinate 50 MG 24 hr tablet Commonly known as: TOPROL-XL Take 50 mg by mouth daily.   13)Mirtazapine 15 MG tablet Commonly known as: REMERON Take 15 mg by mouth at bedtime.  14)Omeprazole 20 MG capsule Commonly known as: PRILOSEC Take 20 mg by mouth daily.  15)OxyCODONE-acetaminophen 5-325 MG tablet Commonly known as: PERCOCET/ROXICET Take 2 tablets by mouth every 6 (six) hours as needed for moderate pain.  16)Pregabalin 75 MG capsule Commonly known as: LYRICA Take 75 mg by mouth 2 (two) times daily.  17)Rosuvastatin 10 MG tablet Commonly known as: CRESTOR Take 10 mg by mouth daily.  15)Senna 8.6 MG Tabs tablet Commonly known as: SENOKOT Take 2 tablets by mouth at bedtime as needed.   16) Timolol 0.5 % ophthalmic solution Commonly known as: TIMOPTIC 1 drop 2 (two) times daily.  Destination: [facility] [place] [facility] Preferred SNF Follow up in 1 week(s).     Service: Skilled Nursing  Contact information:  [address]  [place] [id number]  [phone]  Procedures/Studies:  Imaging Results   Radiology Studies:  Imaging Results (Last 48 hours)[] Expand by Default  DG Lumbar Spine Complete  Result Date: 05/28/2019 CLINICAL DATA:  Larey SeatFell backwards, lumbar spine pain EXAM: LUMBAR SPINE - COMPLETE 4+ VIEW COMPARISON:  None. FINDINGS: Frontal, bilateral oblique, and lateral views of the lumbar spine are obtained. There are 5 non-rib-bearing lumbar type vertebral bodies. There is an acute L1 compression deformity with fracture line seen through the anterior aspect of the  vertebral body. Less than 25% loss of height. No retropulsion. There is disc space narrowing at L5/S1. Diffuse facet hypertrophy greatest at the lumbosacral junction. Sacroiliac joints are normal. IMPRESSION: 1. Acute L1 compression deformity with less than 25% loss of height and no retropulsion. 2. Lower lumbar spondylosis and facet hypertrophy. Electronically Signed   By: Sharlet SalinaMichael  Brown M.D.   On: 05/28/2019 18:26   CT Head Wo Contrast  Result Date: 05/28/2019 CLINICAL DATA:  Status post fall. EXAM: CT HEAD WITHOUT CONTRAST TECHNIQUE: Contiguous axial images were obtained from the base of the skull through the vertex without intravenous contrast. COMPARISON:  None. FINDINGS: Brain: There is mild cerebral atrophy with widening of the extra-axial spaces and ventricular dilatation. There are areas of decreased attenuation within the white matter tracts of the supratentorial brain, consistent with microvascular disease changes. Mild bilateral basal ganglia calcification is seen, right greater than left. Vascular: No hyperdense vessel or unexpected calcification. Skull: Normal. Negative for fracture or focal lesion. Sinuses/Orbits: No acute finding. Other: Mild-to-moderate severity scalp soft tissue swelling is seen near the posterior aspect of the vertex on the left. IMPRESSION: 1. Mild-to-moderate severity scalp soft tissue swelling near the posterior aspect of the vertex on the left. 2. No acute intracranial abnormality. Electronically Signed   By:  Aram Candela M.D.   On: 05/28/2019 18:26   CT Chest Wo Contrast  Result Date: 05/28/2019 CLINICAL DATA:  Status post fall. EXAM: CT CHEST WITHOUT CONTRAST TECHNIQUE: Multidetector CT imaging of the chest was performed following the standard protocol without IV contrast. COMPARISON:  None. FINDINGS: Cardiovascular: There is moderate severity calcification of the thoracic aorta. Normal heart size. No pericardial effusion. Mediastinum/Nodes: No enlarged  mediastinal or axillary lymph nodes. Thyroid gland, trachea, and esophagus demonstrate no significant findings. Lungs/Pleura: Lungs are clear. No pleural effusion or pneumothorax. Upper Abdomen: Noninflamed diverticula are seen throughout the large bowel. Musculoskeletal: Acute fractures of the posterior third and fourth left ribs are seen. Chronic lateral fifth, sixth and seventh right rib fractures are noted. Moderate to marked severity multilevel degenerative changes seen throughout the thoracic spine. IMPRESSION: 1. Acute posterior third and fourth left rib fractures. 2. Noninflamed diverticula throughout the large bowel. 3. Moderate to marked severity multilevel degenerative changes throughout the thoracic spine. Aortic Atherosclerosis (ICD10-I70.0). Electronically Signed   By: Aram Candela M.D.   On: 05/28/2019 19:27   CT Cervical Spine Wo Contrast  Result Date: 05/28/2019 CLINICAL DATA:  Larey Seat, back and left shoulder pain EXAM: CT CERVICAL SPINE WITHOUT CONTRAST TECHNIQUE: Multidetector CT imaging of the cervical spine was performed without intravenous contrast. Multiplanar CT image reconstructions were also generated. COMPARISON:  None. FINDINGS: Alignment: Alignment is anatomic. Skull base and vertebrae: No acute displaced cervical spine fractures. Soft tissues and spinal canal: No prevertebral fluid or swelling. No visible canal hematoma. Disc levels: Multilevel spondylosis and facet hypertrophy greatest from C2 through C5. Upper chest: Displaced left posterior third rib fracture is partially visualized. There is adjacent soft tissue swelling. Please refer to left shoulder x-ray report. Other: Reconstructed images demonstrate no additional findings. IMPRESSION: 1. No acute cervical spine fracture. 2. Posterior left third rib fracture with associated chest wall soft tissue swelling. Please refer to left shoulder x-ray report. Electronically Signed   By: Sharlet Salina M.D.   On: 05/28/2019 18:23    DG Shoulder Left  Result Date: 05/28/2019 CLINICAL DATA:  Larey Seat, left shoulder pain EXAM: LEFT SHOULDER - 2+ VIEW COMPARISON:  None. FINDINGS: Frontal, transscapular, and axillary views of the left shoulder are obtained. There are displaced left posterior third and fourth rib fractures. I do not see any pneumothorax. There is irregular lucency in the infraspinous region the scapula suspicious for fracture. There is glenohumeral and acromioclavicular joint osteoarthritis. IMPRESSION: 1. Displaced left posterior third and fourth rib fractures without definite pneumothorax. 2. Scapular fracture involving the infraspinous region. No definite intra-articular extension. 3. Osteoarthritis. Electronically Signed   By: Sharlet Salina M.D.   On: 05/28/2019 18:25     Subjective: The patient is resting quietly. No new complaints.  Seen and examined.  Foley removed and able to void without difficulty.  Pain better controlled on current regimen.  Vitals:  Vitals:   06/01/19 0825 06/01/19 1549  BP: (!) 181/76 (!) 179/75  Pulse: 91 84  Resp: 18 16  Temp: 98.2 F (36.8 C) (!) 97.5 F (36.4 C)  SpO2: 94% 94%   Discharge Exam: Exam:  Constitutional:  . The patient is awake, alert, and oriented x 3. No acute distress. Eyes:  . pupils and irises appear normal . Normal lids and conjunctivae ENMT:  . grossly normal hearing  . Lips appear normal . external ears, nose appear normal . Oropharynx: mucosa, tongue,posterior pharynx appear normal Neck:  . neck appears normal, no masses, normal  ROM, supple . no thyromegaly Respiratory:  . No increased work of breathing. . No wheezes, rales, or rhonchi . No tactile fremitus Cardiovascular:  . Regular rate and rhythm . No murmurs, ectopy, or gallups. . No lateral PMI. No thrills. Abdomen:  . Abdomen is soft, non-tender, non-distended . No hernias, masses, or organomegaly . Normoactive bowel sounds.  Musculoskeletal:  . No cyanosis, clubbing, or  edema Skin:  . No rashes, lesions, ulcers . palpation of skin: no induration or nodules Neurologic:  . CN 2-12 intact . Sensation all 4 extremities intact Psychiatric:  . Mental status o Mood, affect appropriate o Orientation to person, place, time  . judgment and insight appear intact  The results of significant diagnostics from this hospitalization (including imaging, microbiology, ancillary and laboratory) are listed below for reference.   Microbiology: Recent Results (from the past 240 hour(s))    SARS Coronavirus 2 by RT PCR (hospital order, performed in [facility] hospital lab) Nasopharyngeal Nasopharyngeal Swab     Status: None Collection Time: 05/28/19  8:03 PM Specimen: Nasopharyngeal Swab  Result: SARS Coronavirus 2 Negative  Comment:   (NOTE)  SARS-CoV-2 target nucleic acids are NOT DETECTED.  The SARS-CoV-2 RNA is generally detectable in upper and lower  respiratory specimens during the acute phase of infection. The lowest  concentration of SARS-CoV-2 viral copies this assay can detect is 250  copies / mL. A negative result does not preclude SARS-CoV-2 infection  and should not be used as the sole basis for treatment or other  patient management decisions.  A negative result may occur with  improper specimen collection / handling, submission of specimen other  than nasopharyngeal swab, presence of viral mutation(s) within the  areas targeted by this assay, and inadequate number of viral copies  (<250 copies / mL). A negative result must be combined with clinical  observations, patient history, and epidemiological information.  Fact Sheet for Patients:    BoilerBrush.com.cy  Fact Sheet for Healthcare Providers:  https://pope.com/  This test is not yet approved or cleared  by the [place] FDA and  has been authorized for detection and/or diagnosis of SARS-CoV-2 by  FDA under an Emergency Use  Authorization (EUA).  This EUA will remain  in effect (meaning this test can be used) for the duration of the  COVID-19 declaration under Section 564(b)(1) of the Act, 21 U.S.C.  section 360bbb-3(b)(1), unless the authorization is terminated or  revoked sooner.  Performed at [facility], [address] [place],  [place] [address]  Labs: BMP Latest Ref Rng & Units 05/31/2019 05/28/2019  Glucose 70 - 99 mg/dL 202(R) 427(C)  BUN 8 - 23 mg/dL 62(B) 20  Creatinine 7.62 - 1.00 mg/dL 8.31(D) 1.76(H)  Sodium 135 - 145 mmol/L 139 140  Potassium 3.5 - 5.1 mmol/L 4.4 4.4  Chloride 98 - 111 mmol/L 109 108  CO2 22 - 32 mmol/L 23 22  Calcium 8.9 - 10.3 mg/dL 8.9 60.7   Liver Function Tests: No flowsheet data found.  CBC: CBC Latest Ref Rng & Units 05/30/2019 05/28/2019  WBC 4.0 - 10.5 K/uL 7.1 12.8(H)  Hemoglobin 12.0 - 15.0 g/dL 10.8(L) 11.6(L)  Hematocrit 36.0 - 46.0 % 33.1(L) 35.3(L)  Platelets 150 - 400 K/uL 106(L) 127(L)    Cardiac Enzymes:Troponin (Point of Care Test) No results for input(s): TROPIPOC in the last 72 hours.  BNP:BNP (last 3 results) No results for input(s): BNP in the last 8760 hours.  ProBNP (last 3 results) No  results for input(s): PROBNP in the last 8760 hours.  CBG:CBG (last 3)  No results for input(s): GLUCAP in the last 72 hours.   Urinalysis:  Micro exam: negative for WBC's or RBC's.  Allergies as of 06/01/2019      Reactions   Codeine Sulfate [codeine]    Rash    Erythromycin    Rash    Tramadol       Medication List    TAKE these medications   acetaminophen 325 MG tablet Commonly known as: TYLENOL Take 2 tablets (650 mg total) by mouth every 6 (six) hours.   Aspirin 81 81 MG EC tablet Generic drug: aspirin Take 81 mg by mouth daily. Swallow whole.   cetirizine 10 MG tablet Commonly known as: ZYRTEC Take 10 mg by mouth daily.   cyanocobalamin 2000 MCG tablet Take 2,000 mcg by mouth daily.   Diclofenac Sodium 3 %  Gel Apply topically as needed.   docusate sodium 100 MG capsule Commonly known as: COLACE Take 100 mg by mouth 2 (two) times daily.   ferrous sulfate 325 (65 FE) MG tablet Take 325 mg by mouth daily with breakfast.   furosemide 20 MG tablet Commonly known as: Lasix Take 0.5 tablets (10 mg total) by mouth daily.   lidocaine 5 % Commonly known as: LIDODERM Place 1 patch onto the skin daily. Remove & Discard patch within 12 hours or as directed by MD   lisinopril 20 MG tablet Commonly known as: ZESTRIL Take 20 mg by mouth daily.   methocarbamol 500 MG tablet Commonly known as: ROBAXIN Take 500 mg by mouth at bedtime.   metoprolol succinate 50 MG 24 hr tablet Commonly known as: TOPROL-XL Take 1 tablet (50 mg total) by mouth daily. Take with or immediately following a meal. Start taking on: Jun 02, 2019 What changed:   medication strength  how much to take  additional instructions   mirtazapine 15 MG tablet Commonly known as: REMERON Take 15 mg by mouth at bedtime.   omeprazole 20 MG capsule Commonly known as: PRILOSEC Take 20 mg by mouth daily.   oxyCODONE-acetaminophen 5-325 MG tablet Commonly known as: PERCOCET/ROXICET Take 2 tablets by mouth every 6 (six) hours as needed for moderate pain.   pregabalin 75 MG capsule Commonly known as: LYRICA Take 75 mg by mouth 2 (two) times daily.   rosuvastatin 10 MG tablet Commonly known as: CRESTOR Take 10 mg by mouth daily.   senna 8.6 MG Tabs tablet Commonly known as: SENOKOT Take 2 tablets by mouth at bedtime as needed.   timolol 0.5 % ophthalmic solution Commonly known as: TIMOPTIC 1 drop 2 (two) times daily.       Time coordinating discharge: 38 minutes  SIGNED:  Karie Kirks, DO [facility].   If 7PM-7AM, please contact night-coverage  [email or web address]  Password [email or web address]                    Routing History

## 2019-06-01 NOTE — NC FL2 (Signed)
[place] MEDICAID FL2 LEVEL OF CARE SCREENING TOOL     IDENTIFICATION  Patient Name: Leah Contreras Birthdate: 1931/03/03 Sex: female Admission Date (Current Location): 05/28/2019  [place] and [place] Number:  Chiropodist and Address:  [facility], [address], [place], [place] [address]      Provider Number: [id number]  Attending Physician Name and Address:  Fran Lowes, DO  Relative Name and Phone Number:  Eloise Levels [phone]    Current Level of Care: Hospital Recommended Level of Care: Skilled Nursing Facility Prior Approval Number:    Date Approved/Denied:   PASRR Number: [id number]  Discharge Plan: SNF    Current Diagnoses: Patient Active Problem List   Diagnosis Date Noted  . Multiple fractures of ribs, left side, subsequent encounter for fracture with routine healing 05/31/2019  . Rib fracture 05/29/2019  . Closed compression fracture of body of L1 vertebra (HCC) 05/29/2019  . Scalp hematoma, initial encounter 05/29/2019  . Scapular fracture 05/29/2019  . History of TIA (transient ischemic attack) 05/29/2019  . Essential hypertension 05/29/2019  . HLD (hyperlipidemia) 05/29/2019  . Acute urinary retention 05/29/2019  . Multiple fractures 05/29/2019  . Fall 05/28/2019    Orientation RESPIRATION BLADDER Height & Weight     Self, Time, Situation, Place  Normal Continent Weight: 165 lb (74.8 kg) Height:  5\' 5"  (165.1 cm)  BEHAVIORAL SYMPTOMS/MOOD NEUROLOGICAL BOWEL NUTRITION STATUS      Continent Diet(regular)  AMBULATORY STATUS COMMUNICATION OF NEEDS Skin   Extensive Assist Verbally Bruising                       Personal Care Assistance Level of Assistance  Bathing, Dressing Bathing Assistance: Limited assistance   Dressing Assistance: Limited assistance     Functional Limitations Info  Sight, Hearing, Speech Sight Info: Adequate Hearing Info: Adequate Speech Info: Adequate    SPECIAL CARE  FACTORS FREQUENCY  PT (By licensed PT), OT (By licensed OT)     PT Frequency: 5x week OT Frequency: 5x week            Contractures Contractures Info: Not present    Additional Factors Info  Code Status Code Status Info: Full Allergies Info: Tramadol, Erythromycin, Codeine           Current Medications (06/01/2019):  This is the current hospital active medication list Current Facility-Administered Medications  Medication Dose Route Frequency Provider Last Rate Last Admin  . acetaminophen (TYLENOL) tablet 650 mg  650 mg Oral Q6H Dhungel, Nishant, MD   650 mg at 06/01/19 1000  . aspirin EC tablet 81 mg  81 mg Oral Daily Tu, Ching T, DO   81 mg at 06/01/19 1001  . bisacodyl (DULCOLAX) suppository 10 mg  10 mg Rectal Once Swayze, Ava, DO      . Chlorhexidine Gluconate Cloth 2 % PADS 6 each  6 each Topical Daily Dhungel, Nishant, MD   6 each at 05/31/19 1340  . docusate sodium (COLACE) capsule 100 mg  100 mg Oral BID Tu, Ching T, DO   100 mg at 06/01/19 1001  . docusate sodium (COLACE) capsule 200 mg  200 mg Oral Once Swayze, Ava, DO      . ferrous sulfate tablet 325 mg  325 mg Oral Q breakfast Tu, Ching T, DO   325 mg at 06/01/19 1001  . ketorolac (TORADOL) 30 MG/ML injection 15 mg  15 mg Intravenous Q8H Dhungel, Nishant, MD   15 mg  at 06/01/19 0548  . loratadine (CLARITIN) tablet 10 mg  10 mg Oral Daily Tu, Ching T, DO   10 mg at 06/01/19 1002  . methocarbamol (ROBAXIN) tablet 500 mg  500 mg Oral QHS Tu, Ching T, DO   500 mg at 05/31/19 2134  . metoprolol succinate (TOPROL-XL) 24 hr tablet 25 mg  25 mg Oral Daily Tu, Ching T, DO   25 mg at 06/01/19 1001  . mirtazapine (REMERON) tablet 15 mg  15 mg Oral QHS Tu, Ching T, DO   15 mg at 05/31/19 2134  . ondansetron (ZOFRAN) injection 4 mg  4 mg Intravenous Q6H PRN Dhungel, Nishant, MD   4 mg at 05/30/19 1034  . oxyCODONE-acetaminophen (PERCOCET/ROXICET) 5-325 MG per tablet 1-2 tablet  1-2 tablet Oral Q6H PRN Tu, Ching T, DO   2 tablet  at 05/31/19 1226  . pantoprazole (PROTONIX) EC tablet 40 mg  40 mg Oral Daily Tu, Ching T, DO   40 mg at 06/01/19 1001  . pregabalin (LYRICA) capsule 75 mg  75 mg Oral BID Tu, Ching T, DO   75 mg at 06/01/19 1002  . rosuvastatin (CRESTOR) tablet 10 mg  10 mg Oral Daily Tu, Ching T, DO   10 mg at 06/01/19 1002  . senna (SENOKOT) tablet 17.2 mg  2 tablet Oral QHS PRN Tu, Ching T, DO      . vitamin B-12 (CYANOCOBALAMIN) tablet 2,000 mcg  2,000 mcg Oral Daily Tu, Ching T, DO   2,000 mcg at 06/01/19 1001     Discharge Medications: Please see discharge summary for a list of discharge medications.  Relevant Imaging Results:  Relevant Lab Results:   Additional Information [address], LCSW

## 2019-06-01 NOTE — Progress Notes (Signed)
MD order received in [facility] to discharge pt to [facility] today; TOC previously established discharge packet for EMS to take to the facility upon discharge; telephone report called to [facility] [phone] spoke with [facility]; pt's discharge pending arrival of EMS for nonemergency transport

## 2019-06-01 NOTE — TOC Transition Note (Signed)
Transition of Care [facility]) - CM/SW Discharge Note   Patient Details  Name: Leah Contreras MRN: [id number] Date of Birth: 03/16/1931  [facility] of [facility]) CM/SW Contact:  Maud Deed, LCSW Phone Number: 06/01/2019, 11:38 AM   Clinical Narrative:    Once pt has BM she is medically stable to discharge per MD. Pt will be transported via EMS, CSW will arrange once nursing staff is ready. Pt's daughter Tresa Endo aware of discharge. CSW confirmed discharge with Tammy at [facility]. Pt will be going to room 608 call to report number is [phone].   Final next level of care: Skilled Nursing Facility Barriers to Discharge: Continued Medical Work up   Patient Goals and CMS Choice Patient states their goals for this hospitalization and ongoing recovery are:: go to rehab for a short time then back home to [facility]      Discharge Placement                Patient to be transferred to facility by: EMS Name of family member notified: Tresa Endo Patient and family notified of of transfer: 06/01/19  Discharge Plan and Services   Discharge Planning Services: CM Consult                                 Social Determinants of Health (SDOH) Interventions     Readmission Risk Interventions No flowsheet data found.

## 2019-06-01 NOTE — Progress Notes (Signed)
911 contacted via telephone call for nonemergency transport to [facility]; advised there are 7 trucks ahead of this call

## 2019-06-01 NOTE — Progress Notes (Signed)
Physical Therapy Treatment Patient Details Name: Leah Contreras MRN: [id number] DOB: 1931/06/26 Today's Date: 06/01/2019    History of Present Illness Pt is an 86 y.o. female with medical history significant for TIA, hypertension, IBS constipation predominant, hyperlipidemia, fibromyalgia, GERD, prediabetes who presents after a mechanical fall. Workup showed posterior L third and 4th rib fx, acute L1 compression deformity with less than 25% loss of height, and L shoulder scapular rx involving infraspinous region.    PT Comments    Pt was seen for mobility and gait with precautions for her spine reviewed.  Pt is in significant pain and discussed support to spine, but is referred to orthopedist for consideration.  PT will continue to work on LE strengthening and balance skills, work on AD management and control of gait quality.  SNF is still indicated as pt is living in ALF and will need to be more independent at her care level.  Follow Up Recommendations  SNF     Equipment Recommendations  (hemiwalker)    Recommendations for Other Services OT consult     Precautions / Restrictions Precautions Precautions: Fall Precaution Comments: watch BP Restrictions Weight Bearing Restrictions: Yes LUE Weight Bearing: Non weight bearing Other Position/Activity Restrictions: sling requires frequent adjustments    Mobility  Bed Mobility Overal bed mobility: Needs Assistance Bed Mobility: Sidelying to Sit;Rolling;Sit to Sidelying Rolling: Min assist Sidelying to sit: Min assist     Sit to sidelying: Mod assist General bed mobility comments: pt is not able to recall teaching of back precautions on teachback  Transfers Overall transfer level: Needs assistance Equipment used: Rolling walker (2 wheeled);1 person hand held assist;Quad cane(hemiwalker unavailable) Transfers: Sit to/from Stand Sit to Stand: Mod assist         General transfer comment: mod assist to stand from all surfaces  over back pain, asked MD for a binder  Ambulation/Gait Ambulation/Gait assistance: Min assist Gait Distance (Feet): 35 Feet Assistive device: Rolling walker (2 wheeled);1 person hand held assist(PT supported and helped advance walker) Gait Pattern/deviations: Step-through pattern;Decreased stride length;Wide base of support Gait velocity: decreased   General Gait Details: step through with awkward turns but related to AD   Stairs             Wheelchair Mobility    Modified Rankin (Stroke Patients Only)       Balance Overall balance assessment: Needs assistance Sitting-balance support: Feet supported;Single extremity supported Sitting balance-Leahy Scale: Good     Standing balance support: Single extremity supported Standing balance-Leahy Scale: Fair Standing balance comment: less than fair dynamically                            Cognition Arousal/Alertness: Awake/alert Behavior During Therapy: WFL for tasks assessed/performed Overall Cognitive Status: Within Functional Limits for tasks assessed                                 General Comments: daughter reports dementia, pt is requiring repeated cues for safety      Exercises General Exercises - Lower Extremity Long Arc Quad: Strengthening;10 reps Heel Slides: Strengthening;10 reps Hip ABduction/ADduction: Strengthening;10 reps Other Exercises Other Exercises: dizziness was an issue in initial sitting but resolved with gait    General Comments General comments (skin integrity, edema, etc.): pt is up to walk with PT helping on RW which works with min assist, cues for direction and support  of posture      Pertinent Vitals/Pain Pain Assessment: Faces Faces Pain Scale: Hurts even more Pain Location: L shoulder, low back Pain Descriptors / Indicators: Grimacing;Guarding Pain Intervention(s): Limited activity within patient's tolerance;Monitored during session;Premedicated before  session;Repositioned    Home Living                      Prior Function            PT Goals (current goals can now be found in the care plan section) Acute Rehab PT Goals Patient Stated Goal: to decrease pain, return home Progress towards PT goals: Progressing toward goals    Frequency    7X/week      PT Plan Current plan remains appropriate    Co-evaluation              AM-PAC PT "6 Clicks" Mobility   Outcome Measure  Help needed turning from your back to your side while in a flat bed without using bedrails?: A Little Help needed moving from lying on your back to sitting on the side of a flat bed without using bedrails?: A Lot Help needed moving to and from a bed to a chair (including a wheelchair)?: A Lot Help needed standing up from a chair using your arms (e.g., wheelchair or bedside chair)?: A Lot Help needed to walk in hospital room?: A Little Help needed climbing 3-5 steps with a railing? : A Lot 6 Click Score: 14    End of Session Equipment Utilized During Treatment: Gait belt Activity Tolerance: Patient limited by pain Patient left: in bed;with call bell/phone within reach;with bed alarm set;with family/visitor present Nurse Communication: Mobility status PT Visit Diagnosis: Other abnormalities of gait and mobility (R26.89);Difficulty in walking, not elsewhere classified (R26.2);Muscle weakness (generalized) (M62.81);Pain;Unsteadiness on feet (R26.81) Pain - Right/Left: (back) Pain - part of body: (back)     Time: 2585-2778 PT Time Calculation (min) (ACUTE ONLY): 32 min  Charges:  $Gait Training: 8-22 mins $Therapeutic Exercise: 8-22 mins               Ivar Drape 06/01/2019, 11:19 AM  Samul Dada, PT MS Acute Rehab Dept. Number: [facility] [id number] and [facility] [phone]

## 2019-06-01 NOTE — Progress Notes (Signed)
PROGRESS NOTE  Leah Contreras XLK:[id number] DOB: 1931-12-21 DOA: 05/28/2019 PCP: Melonie Florida, FNP  Brief History   86 year old female resident of independent living with history of TIA, hypertension, IBS, hyperlipidemia, prediabetes presented with mechanical fall when she was taking a shuttle bus and lost her balance.  Reports she normally uses a walker at home.  Patient hit her head and thinks to me have lost her consciousness briefly.  Denies any dizziness, shortness of breath, loss of bowel or bladder control.  Has had a mechanical fall 5 months back and fractured few of her right ribs.  Patient also reported being unable to urinate since the morning of admission.  In the ED patient was hypertensive.  CT of the head showed soft tissue swelling of the posterior scalp.  CT of the cervical spine negative for any injury.  CT of the chest showed posterior left third and fourth rib fracture.  X-ray of the left shoulder shows scapular fracture involving the infraspinous region.  X-ray of the lumbar spine showed acute L1 compression deformity less than 25% loss of height. Patient placed in observation for multiple fractures and pain control.  The patient was evaluated by orthopedic surgery. They have recommended NWB status for the left upper extremity and use a hemiwalker for physical therapy. The minimally displaced scapular fracture, it should heal over the next 4-6 weeks. Ongoing PT will be required for rehabilitation of her rotator cuff. Pain control and PT was recommended for Acute L1 compression deformity as well as fitting the patient with a TLSO brace.   Foley catheter has been removed, and voiding trial has been ordered.  Recommendation of PT/OT is for discharge to SNF.   Consultants  . Orthopedic surgery  Procedures  . None  Antibiotics   Anti-infectives (From admission, onward)   None    .  Subjective  The patient is resting comfortably. No new complaints.  Objective    Vitals:  Vitals:   05/31/19 2247 06/01/19 0825  BP: 138/65 (!) 181/76  Pulse: 74 91  Resp: 16 18  Temp: 98.4 F (36.9 C) 98.2 F (36.8 C)  SpO2: 93% 94%    Exam:  Constitutional:  . The patient is awake, alert, and oriented x 3. No acute distress. Respiratory:  . No increased work of breathing. . No wheezes, rales, or rhonchi . No tactile fremitus Cardiovascular:  . Regular rate and rhythm . No murmurs, ectopy, or gallups. . No lateral PMI. No thrills. Abdomen:  . Abdomen is soft, non-tender, non-distended . No hernias, masses, or organomegaly . Normoactive bowel sounds.  Musculoskeletal:  . No cyanosis, clubbing, or edema Skin:  . No rashes, lesions, ulcers . palpation of skin: no induration or nodules Neurologic:  . CN 2-12 intact . Sensation all 4 extremities intact Psychiatric:  . Mental status o Mood, affect appropriate o Orientation to person, place, time  . judgment and insight appear intact  I have personally reviewed the following:   Today's Data  . Vitals  Imaging  . CT chest . X-ray of left shoulder . X-ray of L-spine  Scheduled Meds: . acetaminophen  650 mg Oral Q6H  . aspirin EC  81 mg Oral Daily  . Chlorhexidine Gluconate Cloth  6 each Topical Daily  . docusate sodium  100 mg Oral BID  . ferrous sulfate  325 mg Oral Q breakfast  . ketorolac  15 mg Intravenous Q8H  . loratadine  10 mg Oral Daily  . methocarbamol  500 mg Oral  QHS  . metoprolol succinate  25 mg Oral Daily  . mirtazapine  15 mg Oral QHS  . pantoprazole  40 mg Oral Daily  . pregabalin  75 mg Oral BID  . rosuvastatin  10 mg Oral Daily  . cyanocobalamin  2,000 mcg Oral Daily   Continuous Infusions:  Principal Problem:   Multiple fractures Active Problems:   Fall   Closed compression fracture of body of L1 vertebra (HCC)   Scalp hematoma, initial encounter   Scapular fracture   History of TIA (transient ischemic attack)   Essential hypertension   HLD  (hyperlipidemia)   Acute urinary retention   Multiple fractures of ribs, left side, subsequent encounter for fracture with routine healing   LOS: 3 days   A & P  Fall, mechanical: Associated with multiple fractures involving left third and fourth rib, left scapular infraspinatus, compression deformity. Pain is better controlled on scheduled Tylenol 650 mg every 6 hours and IV Toradol 15 mg every 8 hours. Continue as needed oxycodone every 6 hours. PT/OT has evaluated the patient. The patient will fitted with TLSO brace for L1 compression fracture. Patient will be NWB on left upper extremity per orthopedic surgery. She will need SNF placement.  Left third and fourth posterior rib fracture: Pain control as above.  Incentive spirometry.  Left scapular infraspinous fracture: Pain control as above.  Pt is NWB on left upper extremity as per orthopedic surgery. She will need hemi-walker.  Acute L1 compression deformity: TLSO, pain control, and therapy as above.  Left shoulder swelling: Head CT without other acute findings. No neurological deficit.  Acute urinary retention: Foley will be discontinued and voiding trial has been ordered. If she fails, she will need to be discharged with foley catheter in place. She will need to follow up with urology as outpatient. UA was negative for UTI. Renal ultrasound was unremarkable with normal appearing kidneys. No clear etiology.  Essential hypertension: Blood pressures are currently quite high on metoprolol 25 mg daily. I have increased the dose to 50 mg of extended release metoprolol daily. Monitor.  I have seen and examined this patient myself. I have spent 35 minutes in her evaluation and care.  DVT prophylaxis: Subcu Lovenox Code Status: Full code Family Communication: None Disposition:   Status is: Observation  The patient will require care spanning > 2 midnights and should be moved to inpatient because: Ongoing active pain requiring  inpatient pain management.  Patient has multiple fractures requiring IV pain medications and unable to ambulate at present due to pain.  She is unsafe to be discharged home and requires close monitoring for adequate pain control.  Dispo: The patient is from: Home  Anticipated d/c is to: SNF  Anticipated d/c date is: 2 days  Patient currently is not medically stable to d/c. Barrier to discharge is need to resolve issue of urinary retention, pain control, constipation, and to have patient fit with TLSO.  Ava Swayze, DO [facility] Direct contact: see [email or web address]  7PM-7AM contact night coverage as above 06/01/2019, 2:57 PM  LOS: 3 days

## 2019-06-02 NOTE — Progress Notes (Signed)
Pt discharged to [facility] via EMS.

## 2019-06-10 ENCOUNTER — Ambulatory Visit (INDEPENDENT_AMBULATORY_CARE_PROVIDER_SITE_OTHER): Payer: Medicare Other

## 2019-06-10 ENCOUNTER — Other Ambulatory Visit: Payer: Self-pay

## 2019-06-10 DIAGNOSIS — R0600 Dyspnea, unspecified: Secondary | ICD-10-CM | POA: Diagnosis not present

## 2019-06-10 DIAGNOSIS — R600 Localized edema: Secondary | ICD-10-CM | POA: Diagnosis not present

## 2019-06-10 DIAGNOSIS — R0609 Other forms of dyspnea: Secondary | ICD-10-CM

## 2019-06-13 ENCOUNTER — Encounter: Payer: Self-pay | Admitting: Cardiology

## 2019-06-13 ENCOUNTER — Ambulatory Visit (INDEPENDENT_AMBULATORY_CARE_PROVIDER_SITE_OTHER): Payer: Medicare Other | Admitting: Cardiology

## 2019-06-13 ENCOUNTER — Other Ambulatory Visit: Payer: Self-pay

## 2019-06-13 VITALS — BP 106/60 | HR 84 | Ht 66.5 in | Wt 165.0 lb

## 2019-06-13 DIAGNOSIS — I10 Essential (primary) hypertension: Secondary | ICD-10-CM | POA: Diagnosis not present

## 2019-06-13 DIAGNOSIS — R0600 Dyspnea, unspecified: Secondary | ICD-10-CM

## 2019-06-13 DIAGNOSIS — R600 Localized edema: Secondary | ICD-10-CM

## 2019-06-13 DIAGNOSIS — R0609 Other forms of dyspnea: Secondary | ICD-10-CM

## 2019-06-13 MED ORDER — LISINOPRIL 10 MG PO TABS
10.0000 mg | ORAL_TABLET | Freq: Every day | ORAL | 3 refills | Status: AC
Start: 2019-06-13 — End: 2019-09-11

## 2019-06-13 MED ORDER — FUROSEMIDE 20 MG PO TABS: 10.0000 mg | ORAL_TABLET | ORAL | 3 refills | Status: DC | PRN

## 2019-06-13 MED ORDER — METOPROLOL SUCCINATE ER 25 MG PO TB24: 25.0000 mg | ORAL_TABLET | Freq: Every day | ORAL | 6 refills | Status: DC

## 2019-06-13 NOTE — Patient Instructions (Signed)
Medication Instructions:   Your physician has recommended you make the following change in your medication:   1.  Decrease Lisinopril: Take 1 tablet (10 mg total) by mouth daily. 2.  Decrease Toprol XL:  Take 1 tablet (25 mg total) by mouth daily 3.  Take your Lasix ONLY when you need it: Take 0.5 tablets (10 mg total) by mouth as needed.  *If you need a refill on your cardiac medications before your next appointment, please call your pharmacy*   Lab Work: None Ordered. If you have labs (blood work) drawn today and your tests are completely normal, you will receive your results only by: Marland Kitchen MyChart Message (if you have MyChart) OR . A paper copy in the mail If you have any lab test that is abnormal or we need to change your treatment, we will call you to review the results.   Testing/Procedures: None Ordered.   Follow-Up: At [facility], you and your health needs are our priority.  As part of our continuing mission to provide you with exceptional heart care, we have created designated Provider Care Teams.  These Care Teams include your primary Cardiologist (physician) and Advanced Practice Providers (APPs -  Physician Assistants and Nurse Practitioners) who all work together to provide you with the care you need, when you need it.  We recommend signing up for the patient portal called "MyChart".  Sign up information is provided on this After Visit Summary.  MyChart is used to connect with patients for Virtual Visits (Telemedicine).  Patients are able to view lab/test results, encounter notes, upcoming appointments, etc.  Non-urgent messages can be sent to your provider as well.   To learn more about what you can do with MyChart, go to [email or web address].    Your next appointment:   2 month(s)  The format for your next appointment:   In Person  Provider:   Debbe Odea, MD   Other Instructions N/A

## 2019-06-13 NOTE — Progress Notes (Signed)
Cardiology Office Note:    Date:  06/13/2019   ID:  Leah Contreras, DOB [birth year], MRN [id number]  PCP:  Melonie Florida, FNP  Cardiologist:  Debbe Odea, MD  Electrophysiologist:  None   Referring MD: Melonie Florida, FNP   Chief Complaint  Patient presents with   Other    Patient c.o SOB when walking.     History of Present Illness:    Leah Contreras is a 86 y.o. female with a hx of hypertension, hyperlipidemia presents for follow-up.  Patient was last seen with shortness of breath and chest tightness.  Due to risk factors, echocardiogram and Myoview was ordered to evaluate cardiac function and presence of ischemia.  Patient also had edema and positional dizziness consistent with vertigo.  She was started on Lasix which has resolved her edema.  She still has fatigue and shortness of breath when walking.  She tripped while trying to get into a bus for field trip, falling backwards, injuring her scapula and ribs.  She is currently in a sling and in rehab.  Her blood pressures have been running low at home.   Past Medical History:  Diagnosis Date   Cervical radiculopathy    Constipation    Fibromyalgia    Gait instability    GERD (gastroesophageal reflux disease)    Glaucoma    Glaucoma    Hyperlipidemia    Hypertension    IBS (irritable bowel syndrome)    Insomnia    Iron deficiency    Irregular heart rhythm    Mild cognitive impairment    Osteoarthritis    RLS (restless legs syndrome)    TIA (transient ischemic attack)    Type 2 diabetes mellitus (HCC)     Past Surgical History:  Procedure Laterality Date   ABDOMINAL HYSTERECTOMY     SKIN CANCER EXCISION     TONSILLECTOMY      Current Medications: Current Meds  Medication Sig   acetaminophen (TYLENOL) 325 MG tablet Take 2 tablets (650 mg total) by mouth every 6 (six) hours.   aspirin (ASPIRIN 81) 81 MG EC tablet Take 81 mg by mouth daily. Swallow whole.   cetirizine (ZYRTEC) 10 MG  tablet Take 10 mg by mouth daily.   cyanocobalamin 2000 MCG tablet Take 2,000 mcg by mouth daily.   Diclofenac Sodium 3 % GEL Apply topically as needed.    docusate sodium (COLACE) 100 MG capsule Take 100 mg by mouth 2 (two) times daily.   ferrous sulfate 325 (65 FE) MG tablet Take 325 mg by mouth daily with breakfast.   furosemide (LASIX) 20 MG tablet Take 0.5 tablets (10 mg total) by mouth as needed.   lidocaine (LIDODERM) 5 % Place 1 patch onto the skin daily. Remove & Discard patch within 12 hours or as directed by MD   methocarbamol (ROBAXIN) 500 MG tablet Take 500 mg by mouth at bedtime.   metoprolol succinate (TOPROL-XL) 25 MG 24 hr tablet Take 1 tablet (25 mg total) by mouth daily. Take with or immediately following a meal.   mirtazapine (REMERON) 15 MG tablet Take 15 mg by mouth at bedtime.   omeprazole (PRILOSEC) 20 MG capsule Take 20 mg by mouth daily.   oxyCODONE-acetaminophen (PERCOCET/ROXICET) 5-325 MG tablet Take 2 tablets by mouth every 6 (six) hours as needed for moderate pain.   pregabalin (LYRICA) 75 MG capsule Take 75 mg by mouth 2 (two) times daily.   rosuvastatin (CRESTOR) 10 MG tablet Take 10 mg by mouth daily.  senna (SENOKOT) 8.6 MG TABS tablet Take 2 tablets by mouth at bedtime as needed.   timolol (TIMOPTIC) 0.5 % ophthalmic solution 1 drop 2 (two) times daily.   [DISCONTINUED] furosemide (LASIX) 20 MG tablet Take 0.5 tablets (10 mg total) by mouth daily.   [DISCONTINUED] lisinopril (ZESTRIL) 20 MG tablet Take 20 mg by mouth daily.   [DISCONTINUED] metoprolol succinate (TOPROL-XL) 50 MG 24 hr tablet Take 1 tablet (50 mg total) by mouth daily. Take with or immediately following a meal.     Allergies:   Codeine sulfate [codeine], Erythromycin, and Tramadol   Social History   Socioeconomic History   Marital status: Widowed    Spouse name: Not on file   Number of children: Not on file   Years of education: Not on file   Highest education  level: Not on file  Occupational History   Not on file  Tobacco Use   Smoking status: Never Smoker   Smokeless tobacco: Never Used  Vaping Use   Vaping Use: Never used  Substance and Sexual Activity   Alcohol use: Not Currently    Comment: rarely   Drug use: Never   Sexual activity: Not on file  Other Topics Concern   Not on file  Social History Narrative   Not on file   Social Determinants of Health   Financial Resource Strain:    Difficulty of Paying Living Expenses:   Food Insecurity:    Worried About Programme researcher, broadcasting/film/video in the Last Year:    Barista in the Last Year:   Transportation Needs:    Freight forwarder (Medical):    Lack of Transportation (Non-Medical):   Physical Activity:    Days of Exercise per Week:    Minutes of Exercise per Session:   Stress:    Feeling of Stress :   Social Connections:    Frequency of Communication with Friends and Family:    Frequency of Social Gatherings with Friends and Family:    Attends Religious Services:    Active Member of Clubs or Organizations:    Attends Engineer, structural:    Marital Status:      Family History: The patient's family history includes Diabetes type II in her mother; Heart disease in her father and mother; Neurologic Disorder in her mother.  ROS:   Please see the history of present illness.     All other systems reviewed and are negative.  EKGs/Labs/Other Studies Reviewed:    The following studies were reviewed today:   EKG:  EKG is  ordered today.  The ekg ordered today demonstrates normal sinus rhythm, possible old inferior infarct.  Recent Labs: 05/30/2019: Hemoglobin 10.8; Platelets 106 05/31/2019: BUN 26; Creatinine, Ser 1.10; Potassium 4.4; Sodium 139  Recent Lipid Panel No results found for: CHOL, TRIG, HDL, CHOLHDL, VLDL, LDLCALC, LDLDIRECT  Physical Exam:    VS:  BP 106/60 (BP Location: Right Arm, Patient Position: Sitting, Cuff Size:  Normal)    Pulse 84    Ht 5' 6.5" (1.689 m)    Wt 165 lb (74.8 kg)    SpO2 96%    BMI 26.23 kg/m     Wt Readings from Last 3 Encounters:  06/13/19 165 lb (74.8 kg)  05/28/19 165 lb (74.8 kg)  05/03/19 175 lb (79.4 kg)     GEN:  Well nourished, well developed in no acute distress HEENT: Normal NECK: No JVD; No carotid bruits LYMPHATICS: No lymphadenopathy CARDIAC: RRR,  no murmurs, rubs, gallops RESPIRATORY:  Clear to auscultation without rales, wheezing or rhonchi  ABDOMEN: Soft, non-tender, non-distended MUSCULOSKELETAL:  no edema; No deformity, varicose veins noted SKIN: Warm and dry NEUROLOGIC:  Alert and oriented x 3 PSYCHIATRIC:  Normal affect   ASSESSMENT:    1. Dyspnea on exertion   2. Edema of lower extremity   3. Essential hypertension    PLAN:    In order of problems listed above:  1. Patient with dyspnea on exertion and some chest tightness.  Echocardiogram showed normal systolic function, EF 60 to 65%, impaired relaxation.  Overall normal echocardiogram for patient's age.  Lexiscan Myoview showed a very small area of apical anterior ischemia, normal wall motion,  Low risk study.  Ejection fraction normal.  Patient reassured.  2. Prior history of lower extremity edema has improved since taking Lasix.  Patient advised to take Lasix only as needed.  Encouraged to prop up her legs while in a seated position.  3. Patient with history of hypertension, blood pressures have been running low at home.  Systolic currently 616.  Will decrease lisinopril to 10 mg daily, decrease Toprol-XL to 25 mg daily.  Hopefully this helps with patient's symptoms of fatigue and shortness of breath.   Follow-up in 2 months   This note was generated in part or whole with voice recognition software. Voice recognition is usually quite accurate but there are transcription errors that can and very often do occur. I apologize for any typographical errors that were not detected and  corrected.  Medication Adjustments/Labs and Tests Ordered: Current medicines are reviewed at length with the patient today.  Concerns regarding medicines are outlined above.  Orders Placed This Encounter  Procedures   EKG 12-Lead   Meds ordered this encounter  Medications   lisinopril (ZESTRIL) 10 MG tablet    Sig: Take 1 tablet (10 mg total) by mouth daily.    Dispense:  90 tablet    Refill:  3   metoprolol succinate (TOPROL-XL) 25 MG 24 hr tablet    Sig: Take 1 tablet (25 mg total) by mouth daily. Take with or immediately following a meal.    Dispense:  30 tablet    Refill:  6   furosemide (LASIX) 20 MG tablet    Sig: Take 0.5 tablets (10 mg total) by mouth as needed.    Dispense:  15 tablet    Refill:  3    Patient Instructions  Medication Instructions:   Your physician has recommended you make the following change in your medication:   1.  Decrease Lisinopril: Take 1 tablet (10 mg total) by mouth daily. 2.  Decrease Toprol XL:  Take 1 tablet (25 mg total) by mouth daily 3.  Take your Lasix ONLY when you need it: Take 0.5 tablets (10 mg total) by mouth as needed.  *If you need a refill on your cardiac medications before your next appointment, please call your pharmacy*   Lab Work: None Ordered. If you have labs (blood work) drawn today and your tests are completely normal, you will receive your results only by:  [place] (if you have MyChart) OR  A paper copy in the mail If you have any lab test that is abnormal or we need to change your treatment, we will call you to review the results.   Testing/Procedures: None Ordered.   Follow-Up: At [facility], you and your health needs are our priority.  As part of our continuing mission to  provide you with exceptional heart care, we have created designated Provider Care Teams.  These Care Teams include your primary Cardiologist (physician) and Advanced Practice Providers (APPs -  Physician Assistants and  Nurse Practitioners) who all work together to provide you with the care you need, when you need it.  We recommend signing up for the patient portal called "MyChart".  Sign up information is provided on this After Visit Summary.  MyChart is used to connect with patients for Virtual Visits (Telemedicine).  Patients are able to view lab/test results, encounter notes, upcoming appointments, etc.  Non-urgent messages can be sent to your provider as well.   To learn more about what you can do with MyChart, go to [email or web address].    Your next appointment:   2 month(s)  The format for your next appointment:   In Person  Provider:   Debbe Odea, MD   Other Instructions N/A     Signed, Debbe Odea, MD  06/13/2019 12:49 PM    [facility]

## 2019-07-30 ENCOUNTER — Emergency Department
Admission: EM | Admit: 2019-07-30 | Discharge: 2019-07-30 | Disposition: A | Discharge status: Home / Self Care | Payer: Medicare Other | Attending: Student in an Organized Health Care Education/Training Program | Admitting: Student in an Organized Health Care Education/Training Program

## 2019-07-30 ENCOUNTER — Emergency Department: Payer: Medicare Other

## 2019-07-30 ENCOUNTER — Other Ambulatory Visit: Payer: Self-pay

## 2019-07-30 ENCOUNTER — Encounter: Payer: Self-pay | Admitting: Emergency Medicine

## 2019-07-30 DIAGNOSIS — Y939 Activity, unspecified: Secondary | ICD-10-CM | POA: Diagnosis not present

## 2019-07-30 DIAGNOSIS — Z7982 Long term (current) use of aspirin: Secondary | ICD-10-CM | POA: Diagnosis not present

## 2019-07-30 DIAGNOSIS — Y999 Unspecified external cause status: Secondary | ICD-10-CM | POA: Diagnosis not present

## 2019-07-30 DIAGNOSIS — W01198A Fall on same level from slipping, tripping and stumbling with subsequent striking against other object, initial encounter: Secondary | ICD-10-CM | POA: Insufficient documentation

## 2019-07-30 DIAGNOSIS — Z79899 Other long term (current) drug therapy: Secondary | ICD-10-CM | POA: Insufficient documentation

## 2019-07-30 DIAGNOSIS — I10 Essential (primary) hypertension: Secondary | ICD-10-CM | POA: Diagnosis not present

## 2019-07-30 DIAGNOSIS — Y929 Unspecified place or not applicable: Secondary | ICD-10-CM | POA: Diagnosis not present

## 2019-07-30 DIAGNOSIS — S0990XA Unspecified injury of head, initial encounter: Secondary | ICD-10-CM | POA: Diagnosis not present

## 2019-07-30 DIAGNOSIS — E119 Type 2 diabetes mellitus without complications: Secondary | ICD-10-CM | POA: Insufficient documentation

## 2019-07-30 DIAGNOSIS — W19XXXA Unspecified fall, initial encounter: Secondary | ICD-10-CM

## 2019-07-30 LAB — URINALYSIS, COMPLETE (UACMP) WITH MICROSCOPIC
Bilirubin Urine: NEGATIVE
Glucose, UA: NEGATIVE mg/dL
Hgb urine dipstick: NEGATIVE
Ketones, ur: NEGATIVE mg/dL
Nitrite: NEGATIVE
Protein, ur: NEGATIVE mg/dL
Specific Gravity, Urine: 1.01 (ref 1.005–1.030)
Squamous Epithelial / HPF: NONE SEEN (ref 0–5)
pH: 5 (ref 5.0–8.0)

## 2019-07-30 LAB — COMPREHENSIVE METABOLIC PANEL
ALT: 11 U/L (ref 0–44)
AST: 21 U/L (ref 15–41)
Albumin: 4.2 g/dL (ref 3.5–5.0)
Alkaline Phosphatase: 89 U/L (ref 38–126)
Anion gap: 6 (ref 5–15)
BUN: 22 mg/dL (ref 8–23)
CO2: 23 mmol/L (ref 22–32)
Calcium: 9.8 mg/dL (ref 8.9–10.3)
Chloride: 105 mmol/L (ref 98–111)
Creatinine, Ser: 1.09 mg/dL — ABNORMAL HIGH (ref 0.44–1.00)
GFR calc Af Amer: 53 mL/min — ABNORMAL LOW (ref >60)
GFR calc non Af Amer: 46 mL/min — ABNORMAL LOW (ref >60)
Glucose, Bld: 125 mg/dL — ABNORMAL HIGH (ref 70–99)
Potassium: 4.6 mmol/L (ref 3.5–5.1)
Sodium: 134 mmol/L — ABNORMAL LOW (ref 135–145)
Total Bilirubin: 0.7 mg/dL (ref 0.3–1.2)
Total Protein: 7.4 g/dL (ref 6.5–8.1)

## 2019-07-30 LAB — CBC WITH DIFFERENTIAL/PLATELET
Abs Immature Granulocytes: 0.05 10*3/uL (ref 0.00–0.07)
Basophils Absolute: 0 10*3/uL (ref 0.0–0.1)
Basophils Relative: 0 %
Eosinophils Absolute: 0.1 10*3/uL (ref 0.0–0.5)
Eosinophils Relative: 1 %
HCT: 32.5 % — ABNORMAL LOW (ref 36.0–46.0)
Hemoglobin: 10.6 g/dL — ABNORMAL LOW (ref 12.0–15.0)
Immature Granulocytes: 1 %
Lymphocytes Relative: 30 %
Lymphs Abs: 2.3 10*3/uL (ref 0.7–4.0)
MCH: 29.6 pg (ref 26.0–34.0)
MCHC: 32.6 g/dL (ref 30.0–36.0)
MCV: 90.8 fL (ref 80.0–100.0)
Monocytes Absolute: 0.6 10*3/uL (ref 0.1–1.0)
Monocytes Relative: 8 %
Neutro Abs: 4.6 10*3/uL (ref 1.7–7.7)
Neutrophils Relative %: 60 %
Platelets: 141 10*3/uL — ABNORMAL LOW (ref 150–400)
RBC: 3.58 MIL/uL — ABNORMAL LOW (ref 3.87–5.11)
RDW: 14 % (ref 11.5–15.5)
WBC: 7.6 10*3/uL (ref 4.0–10.5)
nRBC: 0 % (ref 0.0–0.2)

## 2019-07-30 NOTE — ED Triage Notes (Addendum)
Here for fall one week ago. Family reports pt has seemed to decline in terms of mental status since; describes difficulty with finding words since fall and increased forgetfulness. Ambulatory to triage with steady gait.  Denies weakness or numbness.  Pt reports just lost balance when fell.  At this time pt is oriented X 3. Has had some dizziness since fall intermittent. Speech WNL at this time.

## 2019-07-30 NOTE — ED Provider Notes (Signed)
[facility] Emergency Department Provider Note    First MD Initiated Contact with Patient 07/30/19 1515     (approximate)  I have reviewed the triage vital signs and the nursing notes.   HISTORY  Chief Complaint Fall    HPI Leah Contreras is a 86 y.o. female with the below listed past medical history presents to the ER for 1 week of increasing confusion after mechanical fall.  Did hit her head.  Does not recall if she had prolonged LOC.  She is on some pain medications for chronic low back pain status post compression fracture.  Denies any numbness or tingling.  Denies any chest pain or palpitations.  No nausea or vomiting.  Primary concern was persistent confusion and sometimes increasingly forgetful according to family and some lightheadedness.    Past Medical History:  Diagnosis Date  . Cervical radiculopathy   . Constipation   . Fibromyalgia   . Gait instability   . GERD (gastroesophageal reflux disease)   . Glaucoma   . Glaucoma   . Hyperlipidemia   . Hypertension   . IBS (irritable bowel syndrome)   . Insomnia   . Iron deficiency   . Irregular heart rhythm   . Mild cognitive impairment   . Osteoarthritis   . RLS (restless legs syndrome)   . TIA (transient ischemic attack)   . Type 2 diabetes mellitus (HCC)    Family History  Problem Relation Age of Onset  . Heart disease Mother   . Diabetes type II Mother   . Neurologic Disorder Mother   . Heart disease Father    Past Surgical History:  Procedure Laterality Date  . ABDOMINAL HYSTERECTOMY    . SKIN CANCER EXCISION    . TONSILLECTOMY     Patient Active Problem List   Diagnosis Date Noted  . Multiple fractures of ribs, left side, subsequent encounter for fracture with routine healing 05/31/2019  . Rib fracture 05/29/2019  . Closed compression fracture of body of L1 vertebra (HCC) 05/29/2019  . Scalp hematoma, initial encounter 05/29/2019  . Scapular fracture 05/29/2019  .  History of TIA (transient ischemic attack) 05/29/2019  . Essential hypertension 05/29/2019  . HLD (hyperlipidemia) 05/29/2019  . Acute urinary retention 05/29/2019  . Multiple fractures 05/29/2019  . Fall 05/28/2019      Prior to Admission medications   Medication Sig Start Date End Date Taking? Authorizing Provider  acetaminophen (TYLENOL) 325 MG tablet Take 2 tablets (650 mg total) by mouth every 6 (six) hours. 05/31/19   Dhungel, Theda Belfast, MD  aspirin (ASPIRIN 81) 81 MG EC tablet Take 81 mg by mouth daily. Swallow whole.    [provider]  cetirizine (ZYRTEC) 10 MG tablet Take 10 mg by mouth daily.    [provider]  cyanocobalamin 2000 MCG tablet Take 2,000 mcg by mouth daily.    [provider]  Diclofenac Sodium 3 % GEL Apply topically as needed.     [provider]  docusate sodium (COLACE) 100 MG capsule Take 100 mg by mouth 2 (two) times daily.    [provider]  ferrous sulfate 325 (65 FE) MG tablet Take 325 mg by mouth daily with breakfast.    [provider]  furosemide (LASIX) 20 MG tablet Take 0.5 tablets (10 mg total) by mouth as needed. 06/13/19   Debbe Odea, MD  lidocaine (LIDODERM) 5 % Place 1 patch onto the skin daily. Remove & Discard patch within 12 hours or  as directed by MD    [provider]  lisinopril (ZESTRIL) 10 MG tablet Take 1 tablet (10 mg total) by mouth daily. 06/13/19 09/11/19  Debbe Odea, MD  methocarbamol (ROBAXIN) 500 MG tablet Take 500 mg by mouth at bedtime. 05/10/19   [provider]  metoprolol succinate (TOPROL-XL) 25 MG 24 hr tablet Take 1 tablet (25 mg total) by mouth daily. Take with or immediately following a meal. 06/13/19   Debbe Odea, MD  mirtazapine (REMERON) 15 MG tablet Take 15 mg by mouth at bedtime.    [provider]  omeprazole (PRILOSEC) 20 MG capsule Take 20 mg by mouth daily.    [provider]  oxyCODONE-acetaminophen  (PERCOCET/ROXICET) 5-325 MG tablet Take 2 tablets by mouth every 6 (six) hours as needed for moderate pain. 05/31/19   Dhungel, Theda Belfast, MD  pregabalin (LYRICA) 75 MG capsule Take 75 mg by mouth 2 (two) times daily.    [provider]  rosuvastatin (CRESTOR) 10 MG tablet Take 10 mg by mouth daily.    [provider]  senna (SENOKOT) 8.6 MG TABS tablet Take 2 tablets by mouth at bedtime as needed. 05/07/19   [provider]  timolol (TIMOPTIC) 0.5 % ophthalmic solution 1 drop 2 (two) times daily.    [provider]    Allergies Codeine sulfate [codeine] and Erythromycin    Social History Social History   Tobacco Use  . Smoking status: Never Smoker  . Smokeless tobacco: Never Used  Vaping Use  . Vaping Use: Never used  Substance Use Topics  . Alcohol use: Not Currently    Comment: rarely  . Drug use: Never    Review of Systems Patient denies headaches, rhinorrhea, blurry vision, numbness, shortness of breath, chest pain, edema, cough, abdominal pain, nausea, vomiting, diarrhea, dysuria, fevers, rashes or hallucinations unless otherwise stated above in HPI. ____________________________________________   PHYSICAL EXAM:  VITAL SIGNS: Vitals:   07/30/19 1005 07/30/19 1417  BP: (!) 129/70 (!) 143/71  Pulse: 70 66  Resp: 16 18  Temp: 98.8 F (37.1 C) 97.8 F (36.6 C)  SpO2: 95% 100%    Constitutional: Alert, pleasant and in NAD  Eyes: Conjunctivae are normal.  Head: Atraumatic. Nose: No congestion/rhinnorhea. Mouth/Throat: Mucous membranes are moist.   Neck: No stridor. Painless ROM.  Cardiovascular: Normal rate, regular rhythm. Grossly normal heart sounds.  Good peripheral circulation. Respiratory: Normal respiratory effort.  No retractions. Lungs CTAB. Gastrointestinal: Soft and nontender. No distention. No abdominal bruits. No CVA tenderness. Genitourinary:  Musculoskeletal: No lower extremity tenderness nor edema.  No joint  effusions. Neurologic:  CN- intact.  No facial droop, Normal FNF.  Normal heel to shin.  Sensation intact bilaterally. Normal speech and language. No gross focal neurologic deficits are appreciated. No gait instability using her assistive device. Skin:  Skin is warm, dry and intact. No rash noted. Psychiatric: Mood and affect are normal. Speech and behavior are normal.  ____________________________________________   LABS (all labs ordered are listed, but only abnormal results are displayed)  Results for orders placed or performed during the hospital encounter of 07/30/19 (from the past 24 hour(s))  CBC with Differential     Status: Abnormal   Collection Time: 07/30/19 10:13 AM  Result Value Ref Range   WBC 7.6 4.0 - 10.5 K/uL   RBC 3.58 (L) 3.87 - 5.11 MIL/uL   Hemoglobin 10.6 (L) 12.0 - 15.0 g/dL   HCT 73.4 (L) 36 - 46 %   MCV 90.8  80.0 - 100.0 fL   MCH 29.6 26.0 - 34.0 pg   MCHC 32.6 30.0 - 36.0 g/dL   RDW 16.114.0 09.611.5 - 04.515.5 %   Platelets 141 (L) 150 - 400 K/uL   nRBC 0.0 0.0 - 0.2 %   Neutrophils Relative % 60 %   Neutro Abs 4.6 1.7 - 7.7 K/uL   Lymphocytes Relative 30 %   Lymphs Abs 2.3 0.7 - 4.0 K/uL   Monocytes Relative 8 %   Monocytes Absolute 0.6 0 - 1 K/uL   Eosinophils Relative 1 %   Eosinophils Absolute 0.1 0 - 0 K/uL   Basophils Relative 0 %   Basophils Absolute 0.0 0 - 0 K/uL   Immature Granulocytes 1 %   Abs Immature Granulocytes 0.05 0.00 - 0.07 K/uL  Comprehensive metabolic panel     Status: Abnormal   Collection Time: 07/30/19 10:13 AM  Result Value Ref Range   Sodium 134 (L) 135 - 145 mmol/L   Potassium 4.6 3.5 - 5.1 mmol/L   Chloride 105 98 - 111 mmol/L   CO2 23 22 - 32 mmol/L   Glucose, Bld 125 (H) 70 - 99 mg/dL   BUN 22 8 - 23 mg/dL   Creatinine, Ser 4.091.09 (H) 0.44 - 1.00 mg/dL   Calcium 9.8 8.9 - 81.110.3 mg/dL   Total Protein 7.4 6.5 - 8.1 g/dL   Albumin 4.2 3.5 - 5.0 g/dL   AST 21 15 - 41 U/L   ALT 11 0 - 44 U/L   Alkaline Phosphatase 89 38 - 126  U/L   Total Bilirubin 0.7 0.3 - 1.2 mg/dL   GFR calc non Af Amer 46 (L) >60 mL/min   GFR calc Af Amer 53 (L) >60 mL/min   Anion gap 6 5 - 15  Urinalysis, Complete w Microscopic     Status: Abnormal   Collection Time: 07/30/19 10:13 AM  Result Value Ref Range   Color, Urine STRAW (A) YELLOW   APPearance CLEAR (A) CLEAR   Specific Gravity, Urine 1.010 1.005 - 1.030   pH 5.0 5.0 - 8.0   Glucose, UA NEGATIVE NEGATIVE mg/dL   Hgb urine dipstick NEGATIVE NEGATIVE   Bilirubin Urine NEGATIVE NEGATIVE   Ketones, ur NEGATIVE NEGATIVE mg/dL   Protein, ur NEGATIVE NEGATIVE mg/dL   Nitrite NEGATIVE NEGATIVE   Leukocytes,Ua TRACE (A) NEGATIVE   RBC / HPF 0-5 0 - 5 RBC/hpf   WBC, UA 6-10 0 - 5 WBC/hpf   Bacteria, UA RARE (A) NONE SEEN   Squamous Epithelial / LPF NONE SEEN 0 - 5   Mucus PRESENT    ____________________________________________  EKG My review and personal interpretation at Time: 10:11   Indication: confusion  Rate: 70  Rhythm: sinus Axis: left Other: normal intervals, no stemi ____________________________________________  RADIOLOGY  I personally reviewed all radiographic images ordered to evaluate for the above acute complaints and reviewed radiology reports and findings.  These findings were personally discussed with the patient.  Please see medical record for radiology report.  ____________________________________________   PROCEDURES  Procedure(s) performed:  Procedures    Critical Care performed: no ____________________________________________   INITIAL IMPRESSION / ASSESSMENT AND PLAN / ED COURSE  Pertinent labs & imaging results that were available during my care of the patient were reviewed by me and considered in my medical decision making (see chart for details).   DDX: ICH, SDH, concussion, CVA, medication effect, electrolyte abnormality, orthostasis, anemia  Leah Contreras is a 86 y.o. who  presents to the ED with symptoms as described above.  Patient's  nontoxic appearing symptoms have been ongoing for 1 week status post mechanical fall.  Does not seem consistent with syncopal event.  She is currently asymptomatic.  Currently at her baseline.  I do suspect a component of polypharmacy and likely increased confusion secondary to tramadol dosing.  CT imaging is reassuring.  Do think she is appropriate for outpatient follow-up.  Have discussed with the patient and available family all diagnostics and treatments performed thus far and all questions were answered to the best of my ability. The patient demonstrates understanding and agreement with plan.      The patient was evaluated in Emergency Department today for the symptoms described in the history of present illness. He/she was evaluated in the context of the global COVID-19 pandemic, which necessitated consideration that the patient might be at risk for infection with the SARS-CoV-2 virus that causes COVID-19. Institutional protocols and algorithms that pertain to the evaluation of patients at risk for COVID-19 are in a state of rapid change based on information released by regulatory bodies including the CDC and federal and state organizations. These policies and algorithms were followed during the patient's care in the ED.  As part of my medical decision making, I reviewed the following data within the electronic MEDICAL RECORD NUMBER Nursing notes reviewed and incorporated, Labs reviewed, notes from prior ED visits and [place] Controlled Substance Database   ____________________________________________   FINAL CLINICAL IMPRESSION(S) / ED DIAGNOSES  Final diagnoses:  Fall, initial encounter  Minor head injury, initial encounter      NEW MEDICATIONS STARTED DURING THIS VISIT:  New Prescriptions   No medications on file     Note:  This document was prepared using Dragon voice recognition software and may include unintentional dictation errors.    Willy Eddy, MD 07/30/19 1538

## 2019-07-30 NOTE — Discharge Instructions (Signed)
I do recommend that you discuss with your PCP discontinuing and/or at least decreasing her tramadol dosing as I suspect this is contributing to her confusion.  Please follow-up with neurology as well as PCP.  Return for any additional questions or concerns.

## 2019-08-15 ENCOUNTER — Ambulatory Visit: Payer: Medicare Other | Admitting: Cardiology

## 2019-08-19 ENCOUNTER — Ambulatory Visit: Payer: Medicare Other | Admitting: Family

## 2019-09-12 ENCOUNTER — Other Ambulatory Visit: Payer: Self-pay

## 2019-09-12 ENCOUNTER — Other Ambulatory Visit: Payer: Self-pay | Admitting: Physical Medicine and Rehabilitation

## 2019-09-12 ENCOUNTER — Ambulatory Visit
Admission: RE | Admit: 2019-09-12 | Discharge: 2019-09-12 | Disposition: A | Discharge status: Home / Self Care | Payer: Medicare Other | Source: Ambulatory Visit | Attending: Physical Medicine and Rehabilitation | Admitting: Physical Medicine and Rehabilitation

## 2019-09-12 DIAGNOSIS — M545 Low back pain, unspecified: Secondary | ICD-10-CM

## 2020-07-16 ENCOUNTER — Encounter: Payer: Self-pay | Admitting: Emergency Medicine

## 2020-07-16 ENCOUNTER — Emergency Department
Admission: EM | Admit: 2020-07-16 | Discharge: 2020-07-16 | Disposition: A | Discharge status: Home / Self Care | Payer: Medicare Other | Attending: Emergency Medicine | Admitting: Emergency Medicine

## 2020-07-16 ENCOUNTER — Other Ambulatory Visit: Payer: Self-pay

## 2020-07-16 DIAGNOSIS — Z8673 Personal history of transient ischemic attack (TIA), and cerebral infarction without residual deficits: Secondary | ICD-10-CM | POA: Diagnosis not present

## 2020-07-16 DIAGNOSIS — Z7982 Long term (current) use of aspirin: Secondary | ICD-10-CM | POA: Insufficient documentation

## 2020-07-16 DIAGNOSIS — R42 Dizziness and giddiness: Secondary | ICD-10-CM | POA: Insufficient documentation

## 2020-07-16 DIAGNOSIS — I10 Essential (primary) hypertension: Secondary | ICD-10-CM | POA: Diagnosis not present

## 2020-07-16 DIAGNOSIS — E119 Type 2 diabetes mellitus without complications: Secondary | ICD-10-CM | POA: Insufficient documentation

## 2020-07-16 DIAGNOSIS — Z79899 Other long term (current) drug therapy: Secondary | ICD-10-CM | POA: Insufficient documentation

## 2020-07-16 LAB — URINALYSIS, COMPLETE (UACMP) WITH MICROSCOPIC
Bacteria, UA: NONE SEEN
Bilirubin Urine: NEGATIVE
Glucose, UA: NEGATIVE mg/dL
Hgb urine dipstick: NEGATIVE
Ketones, ur: NEGATIVE mg/dL
Leukocytes,Ua: NEGATIVE
Nitrite: NEGATIVE
Protein, ur: NEGATIVE mg/dL
Specific Gravity, Urine: 1.006 (ref 1.005–1.030)
Squamous Epithelial / HPF: NONE SEEN (ref 0–5)
pH: 7 (ref 5.0–8.0)

## 2020-07-16 LAB — CBC
HCT: 32.3 % — ABNORMAL LOW (ref 36.0–46.0)
Hemoglobin: 11 g/dL — ABNORMAL LOW (ref 12.0–15.0)
MCH: 30.5 pg (ref 26.0–34.0)
MCHC: 34.1 g/dL (ref 30.0–36.0)
MCV: 89.5 fL (ref 80.0–100.0)
Platelets: 127 10*3/uL — ABNORMAL LOW (ref 150–400)
RBC: 3.61 MIL/uL — ABNORMAL LOW (ref 3.87–5.11)
RDW: 12.7 % (ref 11.5–15.5)
WBC: 5.3 10*3/uL (ref 4.0–10.5)
nRBC: 0 % (ref 0.0–0.2)

## 2020-07-16 LAB — BASIC METABOLIC PANEL
Anion gap: 8 (ref 5–15)
BUN: 15 mg/dL (ref 8–23)
CO2: 24 mmol/L (ref 22–32)
Calcium: 10.3 mg/dL (ref 8.9–10.3)
Chloride: 107 mmol/L (ref 98–111)
Creatinine, Ser: 1 mg/dL (ref 0.44–1.00)
GFR, Estimated: 55 mL/min — ABNORMAL LOW (ref >60)
Glucose, Bld: 176 mg/dL — ABNORMAL HIGH (ref 70–99)
Potassium: 4.1 mmol/L (ref 3.5–5.1)
Sodium: 139 mmol/L (ref 135–145)

## 2020-07-16 LAB — TROPONIN I (HIGH SENSITIVITY): Troponin I (High Sensitivity): 5 ng/L (ref <18)

## 2020-07-16 MED ORDER — CLONIDINE HCL 0.1 MG PO TABS
0.1000 mg | ORAL_TABLET | Freq: Once | ORAL | Status: AC
  Administered 2020-07-16: 0.1 mg via ORAL
  Filled 2020-07-16: qty 1

## 2020-07-16 MED ORDER — MECLIZINE HCL 25 MG PO TABS: 25.0000 mg | ORAL_TABLET | Freq: Three times a day (TID) | ORAL | 0 refills | Status: DC | PRN

## 2020-07-16 MED ORDER — MECLIZINE HCL 25 MG PO TABS
25.0000 mg | ORAL_TABLET | Freq: Once | ORAL | Status: AC
  Administered 2020-07-16: 25 mg via ORAL
  Filled 2020-07-16: qty 1

## 2020-07-16 NOTE — ED Triage Notes (Signed)
Presents via EMS from [facility]   woke with some dizziness this am  no pain  positive nausea no vomiting or fever

## 2020-07-16 NOTE — ED Notes (Signed)
Patient sitting; eating meal; family at bedside

## 2020-07-16 NOTE — ED Provider Notes (Signed)
[facility] Emergency Department Provider Note  Time seen: 10:24 AM  I have reviewed the triage vital signs and the nursing notes.   HISTORY  Chief Complaint Dizziness   HPI Azile Minardi is a 87 y.o. female with a past medical history of fibromyalgia, gastric reflux, hypertension, hyperlipidemia, presents to the emergency department for dizziness.  According to the patient she woke this morning feeling dizzy and off balance.  Patient states a history of vertigo in the past, states she had a spell several months ago.  Patient states she believes she has taken meclizine in the past.  Patient denies any headache.  Denies any weakness or numbness of any arm or leg confusion or difficulty speaking or thinking.  Denies any recent illnesses cough congestion fever vomiting or diarrhea. Past Medical History:  Diagnosis Date   Cervical radiculopathy    Constipation    Fibromyalgia    Gait instability    GERD (gastroesophageal reflux disease)    Glaucoma    Glaucoma    Hyperlipidemia    Hypertension    IBS (irritable bowel syndrome)    Insomnia    Iron deficiency    Irregular heart rhythm    Mild cognitive impairment    Osteoarthritis    RLS (restless legs syndrome)    TIA (transient ischemic attack)    Type 2 diabetes mellitus (HCC)     Patient Active Problem List   Diagnosis Date Noted   Multiple fractures of ribs, left side, subsequent encounter for fracture with routine healing 05/31/2019   Rib fracture 05/29/2019   Closed compression fracture of body of L1 vertebra (HCC) 05/29/2019   Scalp hematoma, initial encounter 05/29/2019   Scapular fracture 05/29/2019   History of TIA (transient ischemic attack) 05/29/2019   Essential hypertension 05/29/2019   HLD (hyperlipidemia) 05/29/2019   Acute urinary retention 05/29/2019   Multiple fractures 05/29/2019   Fall 05/28/2019    Past Surgical History:  Procedure Laterality Date   ABDOMINAL HYSTERECTOMY      SKIN CANCER EXCISION     TONSILLECTOMY      Prior to Admission medications   Medication Sig Start Date End Date Taking? Authorizing Provider  acetaminophen (TYLENOL) 325 MG tablet Take 2 tablets (650 mg total) by mouth every 6 (six) hours. 05/31/19   Dhungel, Theda Belfast, MD  aspirin (ASPIRIN 81) 81 MG EC tablet Take 81 mg by mouth daily. Swallow whole.    [provider]  cetirizine (ZYRTEC) 10 MG tablet Take 10 mg by mouth daily.    [provider]  cyanocobalamin 2000 MCG tablet Take 2,000 mcg by mouth daily.    [provider]  Diclofenac Sodium 3 % GEL Apply topically as needed.     [provider]  docusate sodium (COLACE) 100 MG capsule Take 100 mg by mouth 2 (two) times daily.    [provider]  ferrous sulfate 325 (65 FE) MG tablet Take 325 mg by mouth daily with breakfast.    [provider]  furosemide (LASIX) 20 MG tablet Take 0.5 tablets (10 mg total) by mouth as needed. 06/13/19   Debbe Odea, MD  lidocaine (LIDODERM) 5 % Place 1 patch onto the skin daily. Remove & Discard patch within 12 hours or as directed by MD    [provider]  lisinopril (ZESTRIL) 10 MG tablet Take 1 tablet (10 mg total) by mouth daily. 06/13/19 09/11/19  Debbe Odea, MD  methocarbamol (ROBAXIN) 500 MG tablet Take 500 mg  by mouth at bedtime. 05/10/19   [provider]  metoprolol succinate (TOPROL-XL) 25 MG 24 hr tablet Take 1 tablet (25 mg total) by mouth daily. Take with or immediately following a meal. 06/13/19   Debbe Odea, MD  mirtazapine (REMERON) 15 MG tablet Take 15 mg by mouth at bedtime.    [provider]  omeprazole (PRILOSEC) 20 MG capsule Take 20 mg by mouth daily.    [provider]  oxyCODONE-acetaminophen (PERCOCET/ROXICET) 5-325 MG tablet Take 2 tablets by mouth every 6 (six) hours as needed for moderate pain. 05/31/19   Dhungel, Theda Belfast, MD  pregabalin (LYRICA) 75 MG capsule Take 75 mg  by mouth 2 (two) times daily.    [provider]  rosuvastatin (CRESTOR) 10 MG tablet Take 10 mg by mouth daily.    [provider]  senna (SENOKOT) 8.6 MG TABS tablet Take 2 tablets by mouth at bedtime as needed. 05/07/19   [provider]  timolol (TIMOPTIC) 0.5 % ophthalmic solution 1 drop 2 (two) times daily.    [provider]    Allergies  Allergen Reactions   Codeine Sulfate [Codeine]     Rash    Erythromycin     Rash     Family History  Problem Relation Age of Onset   Heart disease Mother    Diabetes type II Mother    Neurologic Disorder Mother    Heart disease Father     Social History Social History   Tobacco Use   Smoking status: Never   Smokeless tobacco: Never  Vaping Use   Vaping Use: Never used  Substance Use Topics   Alcohol use: Not Currently    Comment: rarely   Drug use: Never    Review of Systems Constitutional: Negative for fever. Cardiovascular: Negative for chest pain. Respiratory: Negative for shortness of breath. Gastrointestinal: Negative for abdominal pain.  Positive for nausea. Musculoskeletal: Negative for musculoskeletal complaints Neurological: Negative for headache.  Denies any weakness or numbness of any arm or leg. All other ROS negative  ____________________________________________   PHYSICAL EXAM:  VITAL SIGNS: ED Triage Vitals  Enc Vitals Group     BP 07/16/20 0956 (!) 186/84     Pulse Rate 07/16/20 0956 77     Resp 07/16/20 0956 18     Temp 07/16/20 0956 98.5 F (36.9 C)     Temp Source 07/16/20 0956 Oral     SpO2 07/16/20 0956 94 %     Weight 07/16/20 0953 157 lb (71.2 kg)     Height 07/16/20 0953 5\' 5"  (1.651 m)     Head Circumference --      Peak Flow --      Pain Score 07/16/20 0953 0     Pain Loc --      Pain Edu? --      Excl. in GC? --    Constitutional: Alert and oriented. Well appearing and in no distress. Eyes: Normal exam ENT      Head: Normocephalic and  atraumatic.      Mouth/Throat: Mucous membranes are moist. Cardiovascular: Normal rate, regular rhythm.  Respiratory: Normal respiratory effort without tachypnea nor retractions. Breath sounds are clear  Gastrointestinal: Soft and nontender. No distention. Musculoskeletal: Nontender with normal range of motion in all extremities.  Neurologic:  Normal speech and language. No gross focal neurologic deficits Skin:  Skin is warm, dry and intact.  Psychiatric: Mood and affect are normal.   ____________________________________________    EKG  EKG viewed and interpreted by myself shows a sinus rhythm at 77 bpm with a narrow QRS, normal axis, normal intervals, nonspecific ST changes.  ____________________________________________    INITIAL IMPRESSION / ASSESSMENT AND PLAN / ED COURSE  Pertinent labs & imaging results that were available during my care of the patient were reviewed by me and considered in my medical decision making (see chart for details).   Patient presents emergency department for dizziness since awakening this morning.  Overall the patient appears well, no distress.  Patient has a reassuring physical exam, no neurological deficits on examination.  We will check labs including a urinalysis, blood work, treat with meclizine and continue to closely monitor.  Patient agreeable to plan of care.  Patient continued to have some dizziness after meclizine.  Labs are reassuring.  I did recommend MRI, they believe the dizziness is due to the patient's blood pressure.  Patient's blood pressure is currently 190 systolic.  Patient given 0.1 mg of clonidine.  After recheck patient's blood pressure has come down to 144/71.  Patient states her symptoms are gone and she is feeling better.  I discussed with the patient if her symptoms return she should return to the emergency department for an MRI.  Patient agreeable.  We will discharge with meclizine and the patient is to follow-up with her PCP.   I discussed this plan of care with the patient and her daughter who are both agreeable  Anetra Czerwinski was evaluated in Emergency Department on 07/16/2020 for the symptoms described in the history of present illness. She was evaluated in the context of the global COVID-19 pandemic, which necessitated consideration that the patient might be at risk for infection with the SARS-CoV-2 virus that causes COVID-19. Institutional protocols and algorithms that pertain to the evaluation of patients at risk for COVID-19 are in a state of rapid change based on information released by regulatory bodies including the CDC and federal and state organizations. These policies and algorithms were followed during the patient's care in the ED.  ____________________________________________   FINAL CLINICAL IMPRESSION(S) / ED DIAGNOSES  Dizziness   Minna Antis, MD 07/16/20 1449

## 2020-07-16 NOTE — Discharge Instructions (Addendum)
Please take your meclizine as prescribed for dizziness, as written.  If you have any return of/worsening dizziness please return to the emergency department for evaluation otherwise please follow-up with your doctor next 2 to 3 days for recheck/reevaluation.

## 2021-03-31 IMAGING — CR DG SHOULDER 2+V*L*
3 series · 3 of 3 positions shown · non-contrast
Comparison: None.

CLINICAL DATA: Fell, left shoulder pain

EXAM:
LEFT SHOULDER - 2+ VIEW

[shoulder grashey (1 of 2)]
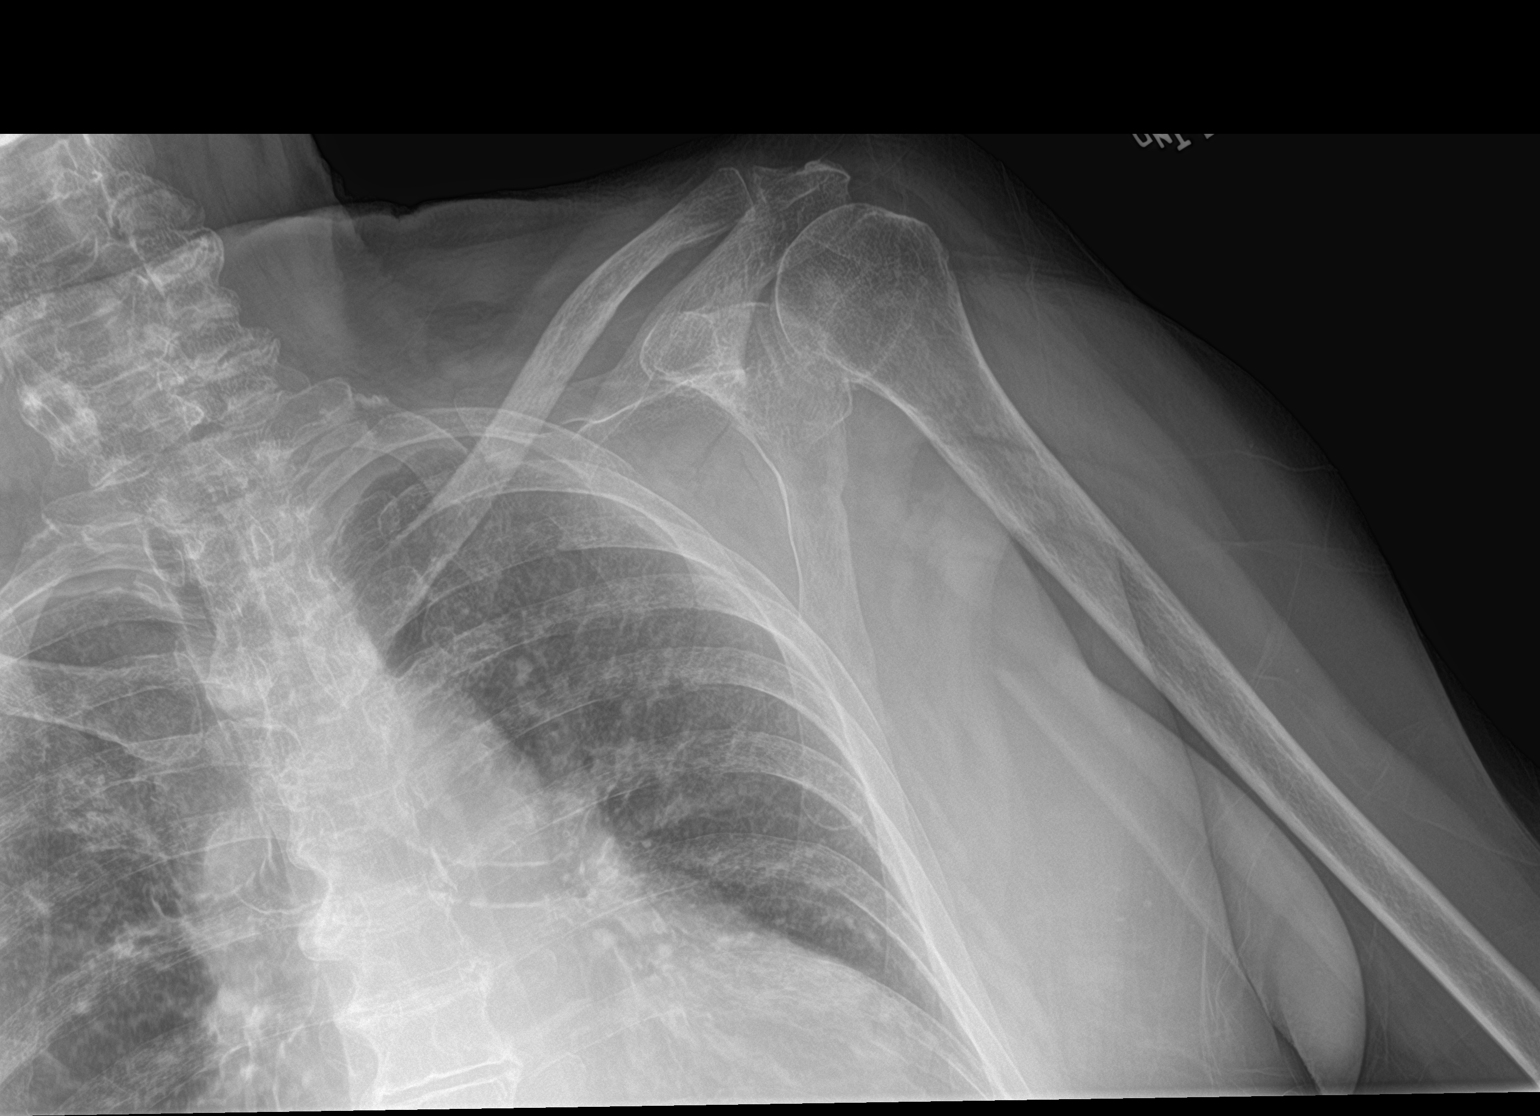

[shoulder y view]
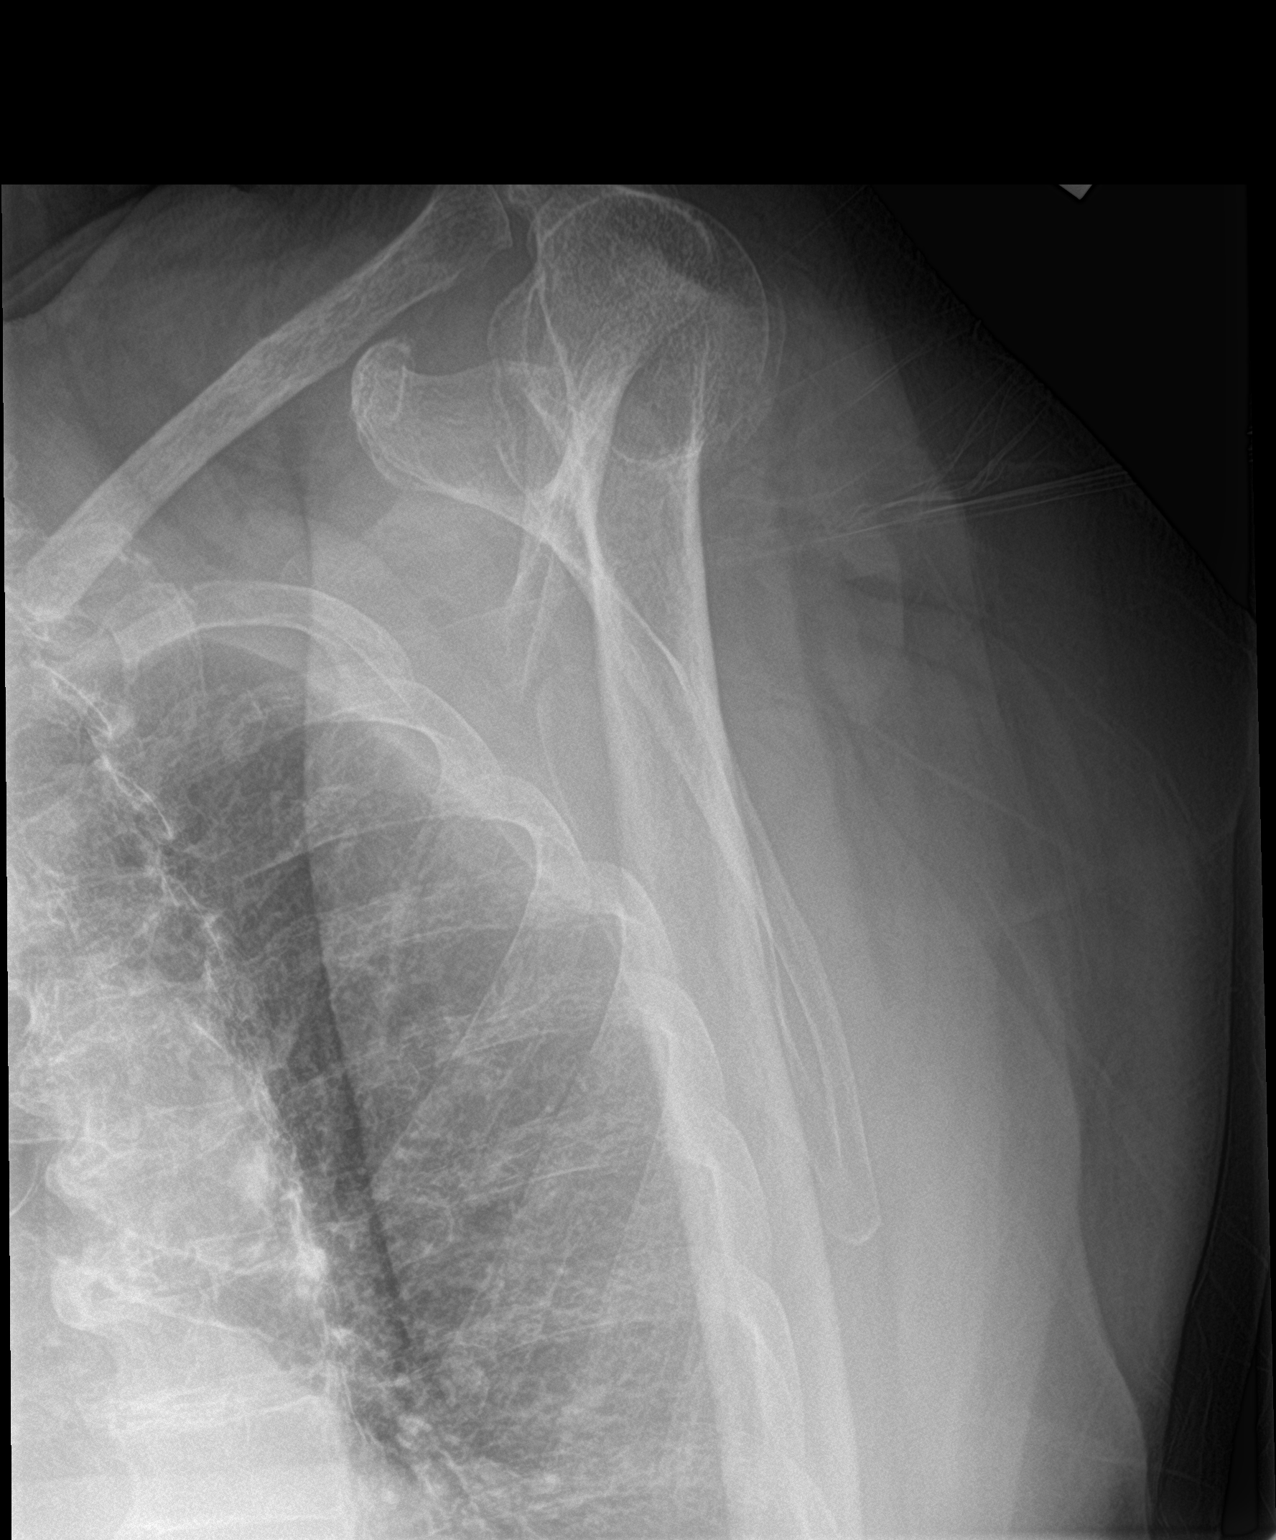

[shoulder grashey (2 of 2)]
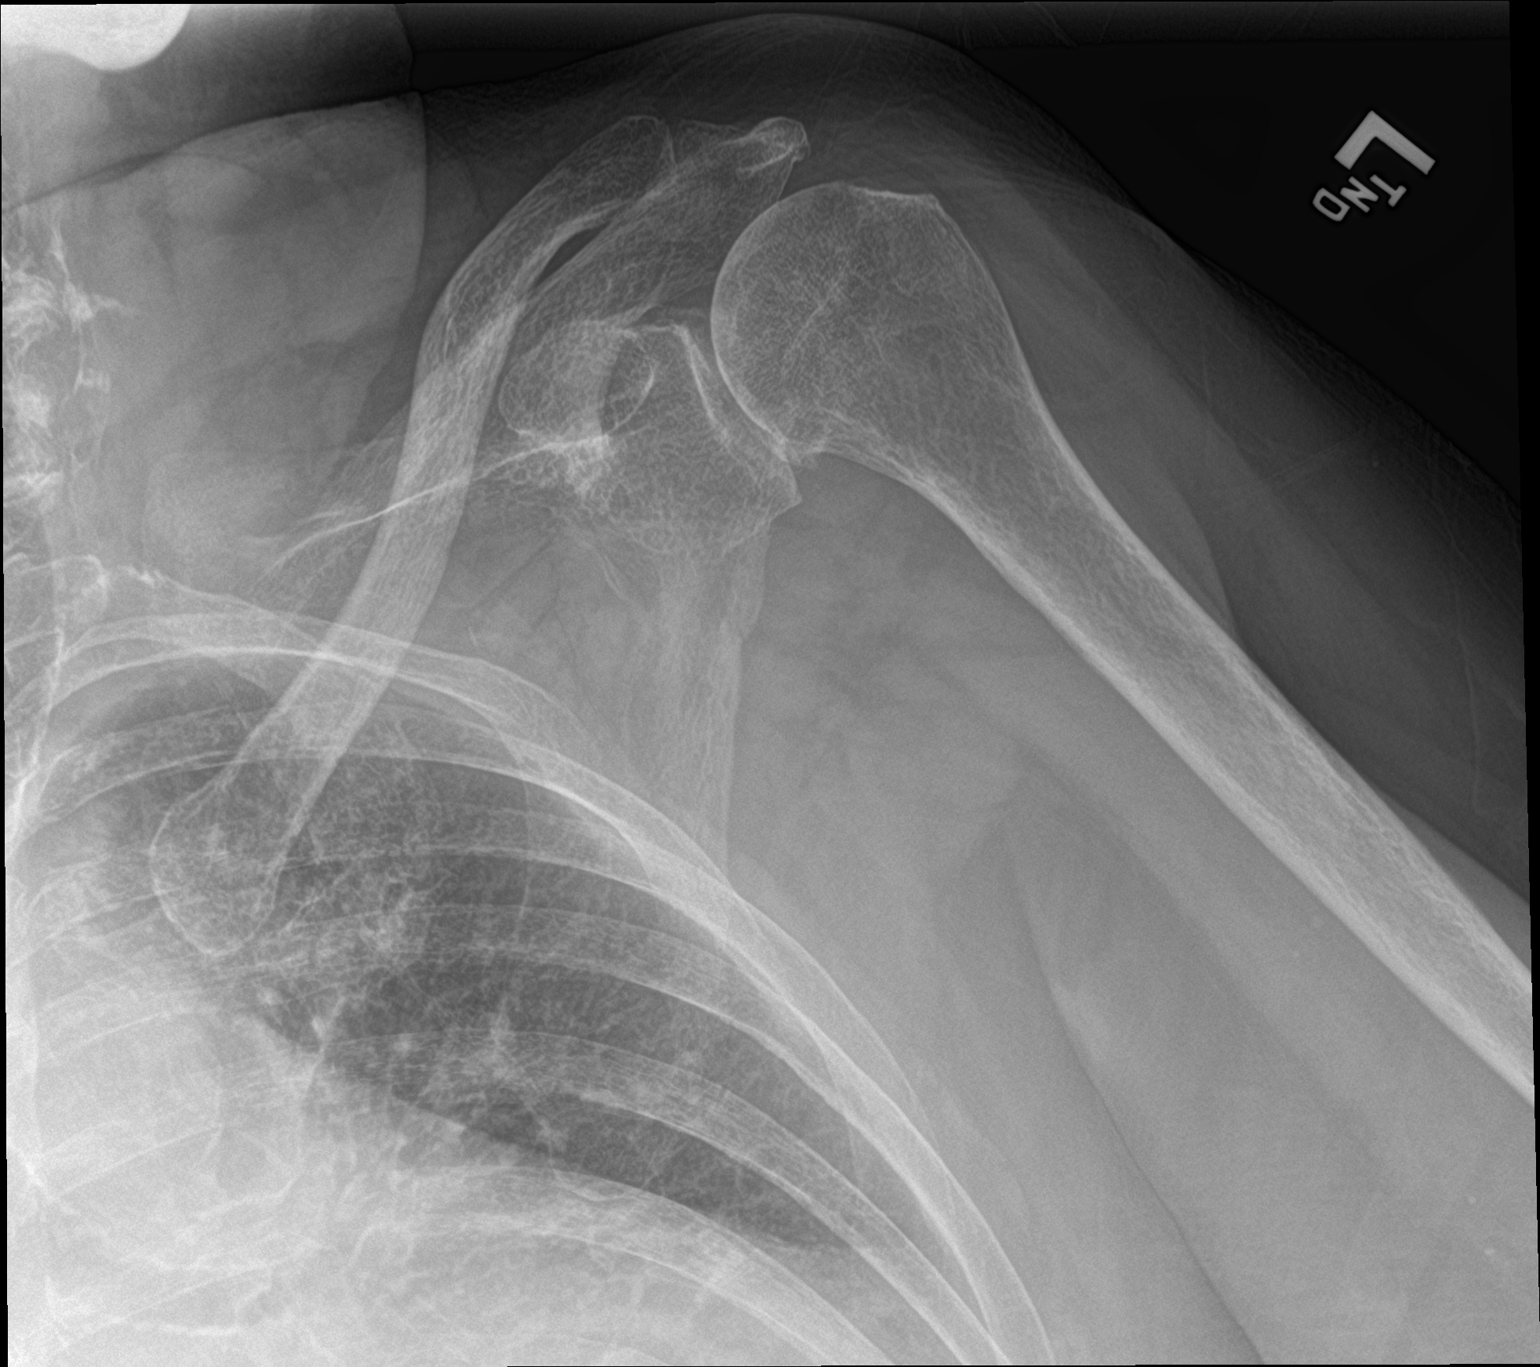

[3 of 3 positions shown; findings below may reference images not displayed]

FINDINGS: Frontal, transscapular, and axillary views of the left shoulder are
obtained. There are displaced left posterior third and fourth rib
fractures. I do not see any pneumothorax.

There is irregular lucency in the infraspinous region the scapula
suspicious for fracture.

There is glenohumeral and acromioclavicular joint osteoarthritis.
IMPRESSION: 1. Displaced left posterior third and fourth rib fractures without
definite pneumothorax.
2. Scapular fracture involving the infraspinous region. No definite
intra-articular extension.
3. Osteoarthritis.

## 2021-03-31 IMAGING — CT CT HEAD W/O CM
3 series · 15 of 47 positions shown, 18 images · non-contrast
Comparison: None.

CLINICAL DATA: Status post fall.

EXAM:
CT HEAD WITHOUT CONTRAST
TECHNIQUE: Contiguous axial images were obtained from the base of the skull
through the vertex without intravenous contrast.

[Series 2: head wo · axial · 0.41mm/px · z∈[-183,-58]mm · 9 of 31 slices shown, 12 images]
[im 3/31  brain]
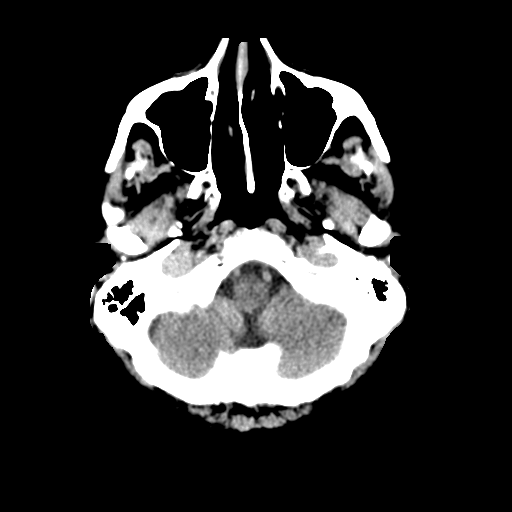
[im 3/31  bone]
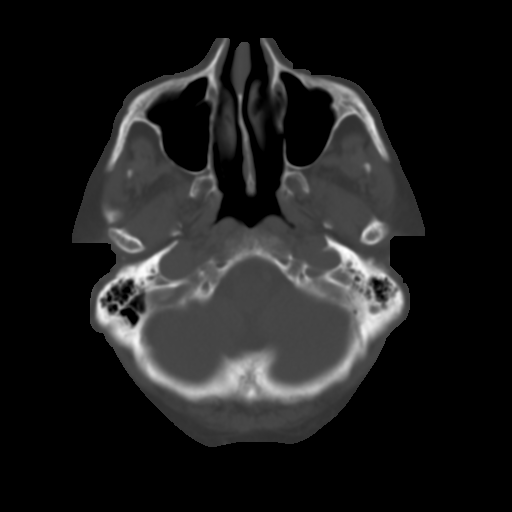
[im 6/31  brain]
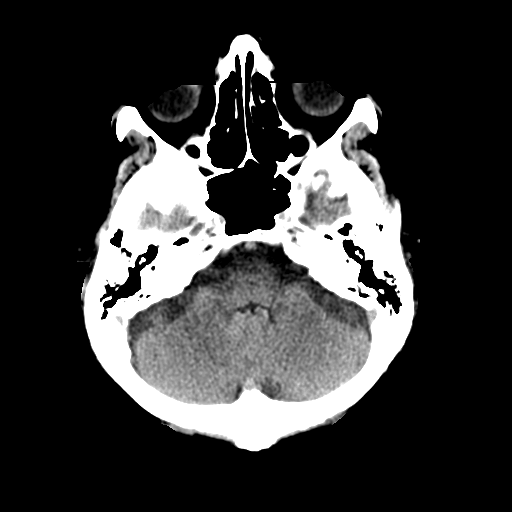
[im 9/31  brain]
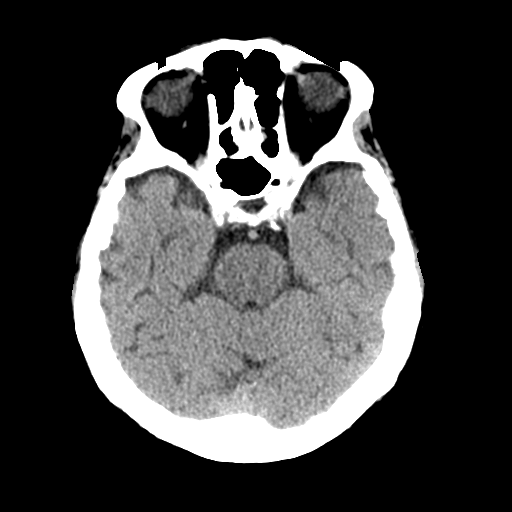
[im 12/31  brain]
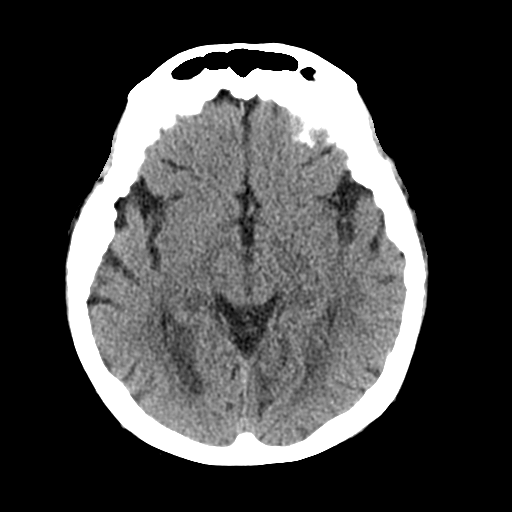
[im 16/31  brain]
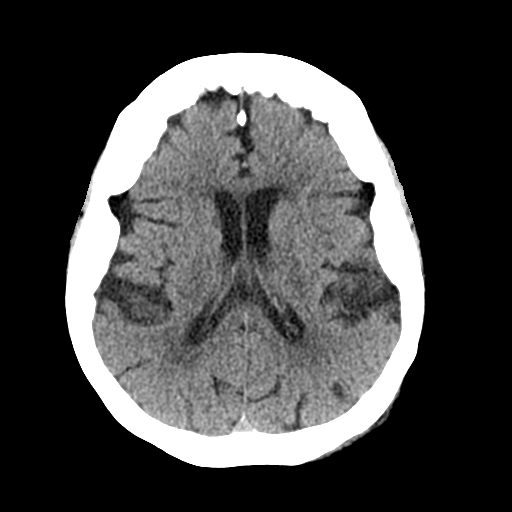
[im 16/31  bone]
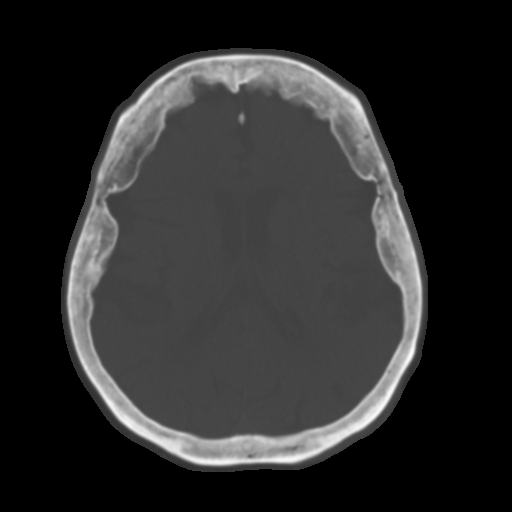
[im 19/31  brain]
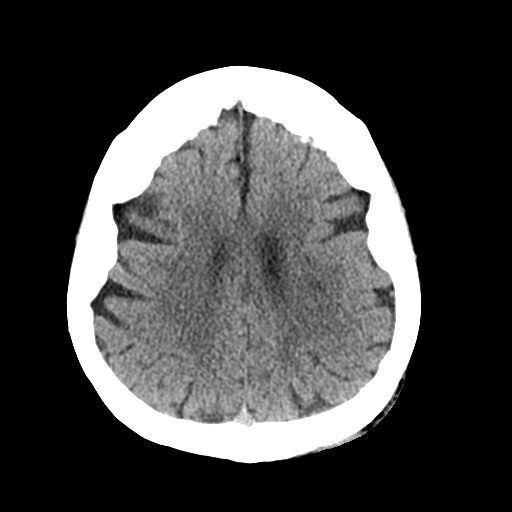
[im 22/31  brain]
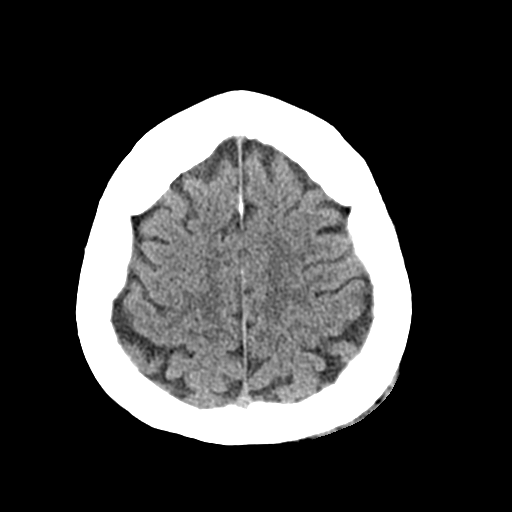
[im 25/31  brain]
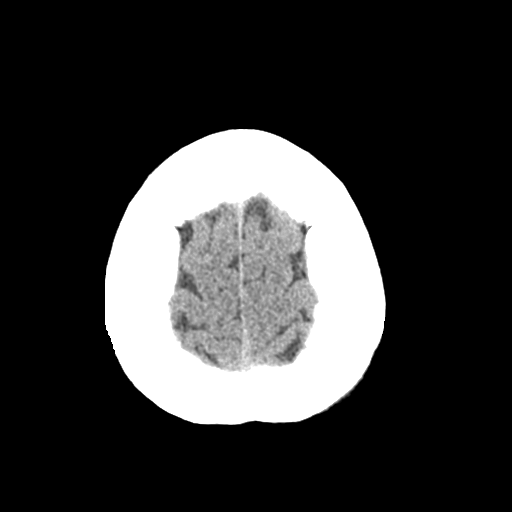
[im 28/31  brain]
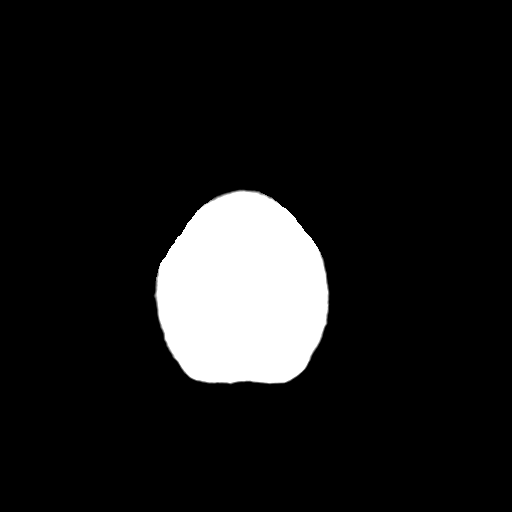
[im 28/31  bone]
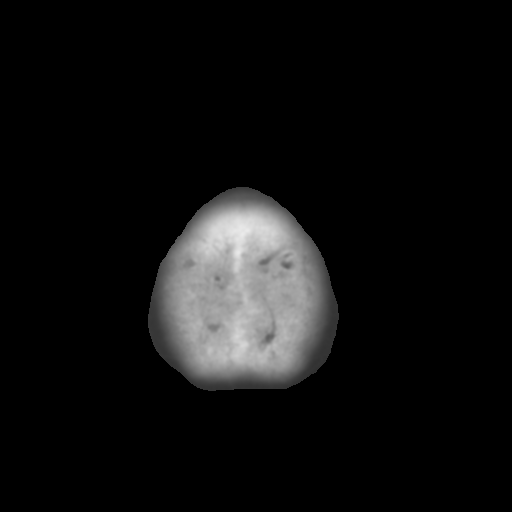

[Series 4: coronal soft tissue · coronal · 0.29mm/px · 3 of 64 slices shown]
[im 22/64  brain]
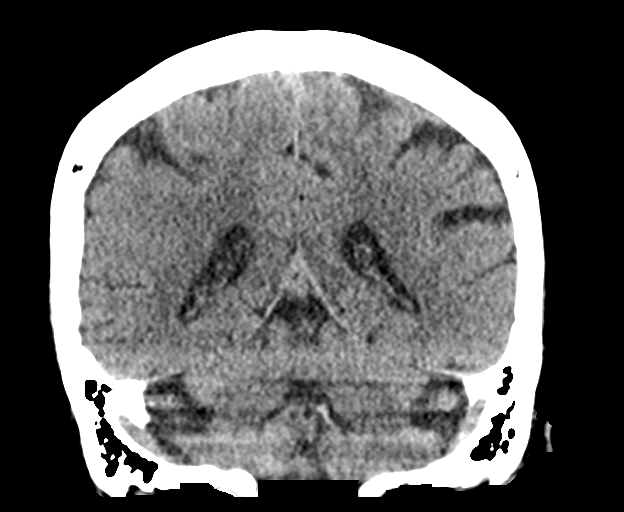
[im 29/64  brain]
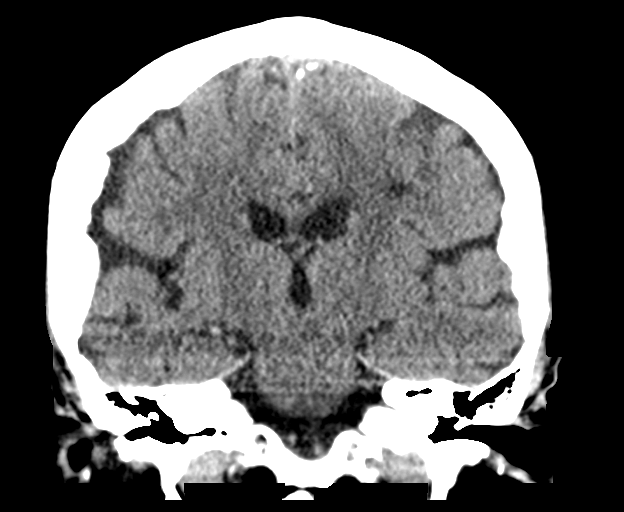
[im 36/64  brain]
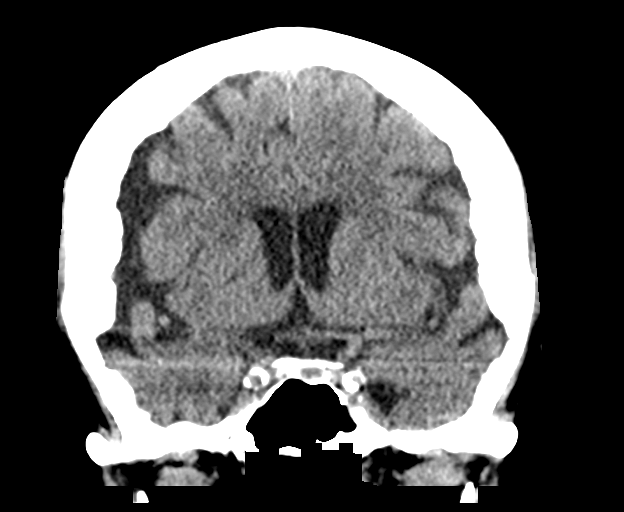

[Series 5: sagittal soft tissue · sagittal · 0.29mm/px · 3 of 54 slices shown]
[im 18/54  brain]
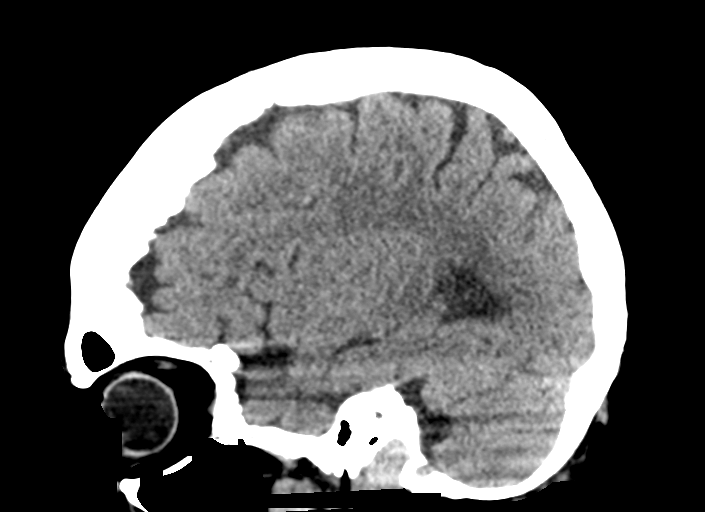
[im 27/54  brain]
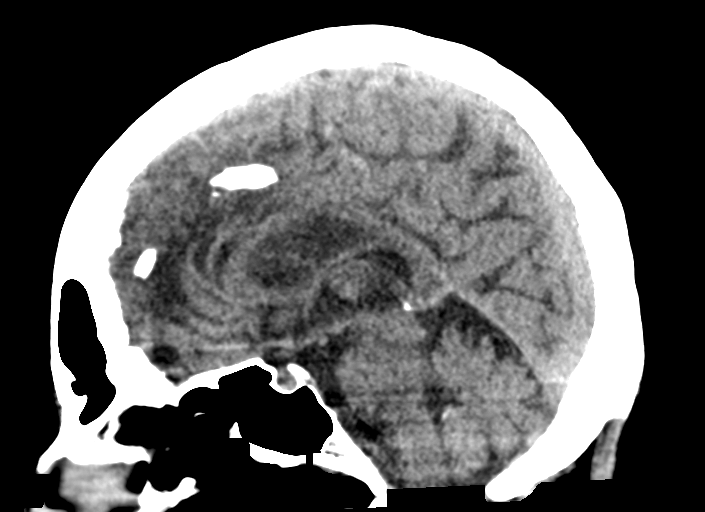
[im 36/54  brain]
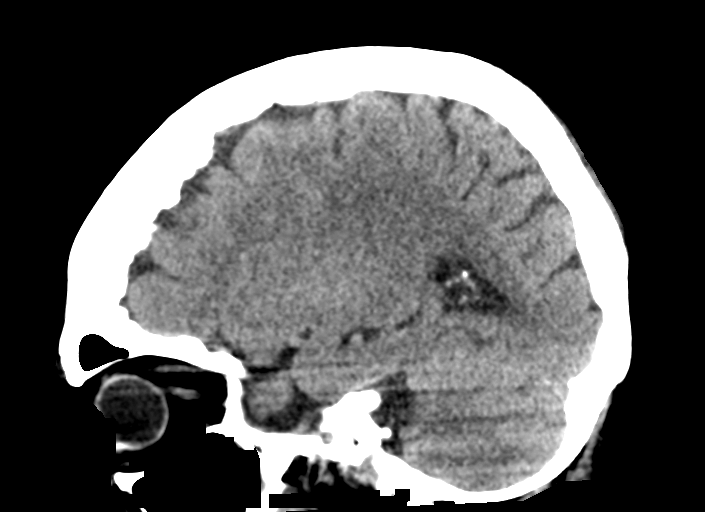

[15 of 47 positions shown; findings below may reference images not displayed]

FINDINGS: Brain: There is mild cerebral atrophy with widening of the
extra-axial spaces and ventricular dilatation.
There are areas of decreased attenuation within the white matter
tracts of the supratentorial brain, consistent with microvascular
disease changes.

Mild bilateral basal ganglia calcification is seen, right greater
than left.

Vascular: No hyperdense vessel or unexpected calcification.

Skull: Normal. Negative for fracture or focal lesion.

Sinuses/Orbits: No acute finding.

Other: Mild-to-moderate severity scalp soft tissue swelling is seen
near the posterior aspect of the vertex on the left.
IMPRESSION: 1. Mild-to-moderate severity scalp soft tissue swelling near the
posterior aspect of the vertex on the left.
2. No acute intracranial abnormality.

## 2021-03-31 IMAGING — CT CT CERVICAL SPINE W/O CM
3 of 4 series · 13 of 33 positions shown, 16 images · non-contrast
Comparison: None.

CLINICAL DATA: Fell, back and left shoulder pain

EXAM:
CT CERVICAL SPINE WITHOUT CONTRAST
TECHNIQUE: Multidetector CT imaging of the cervical spine was performed without
intravenous contrast. Multiplanar CT image reconstructions were also
generated.

[Series 4: sagittal bone · sagittal · 0.23mm/px · 5 of 51 slices shown, 6 images]
[im 17/51  bone]
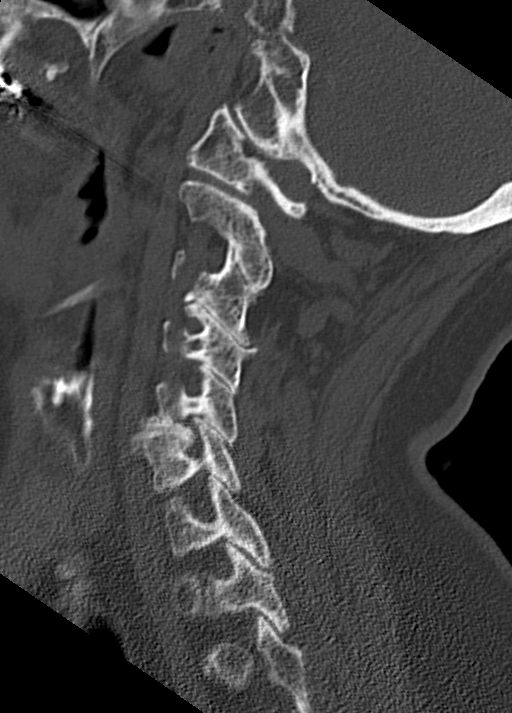
[im 21/51  bone]
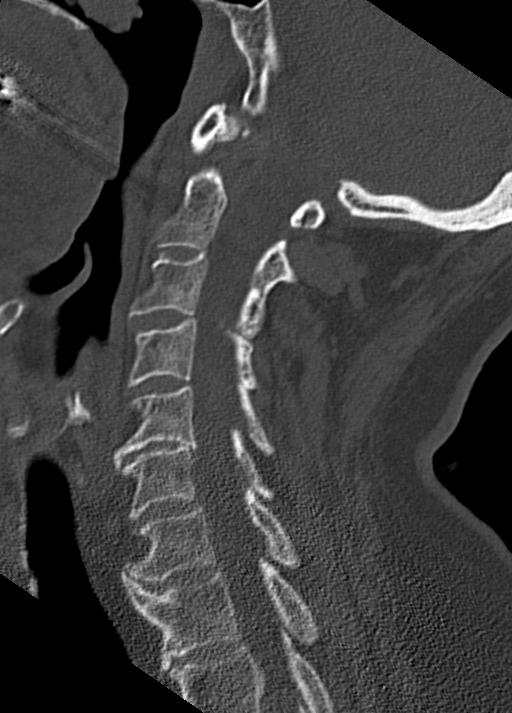
[im 26/51  soft-tissue]
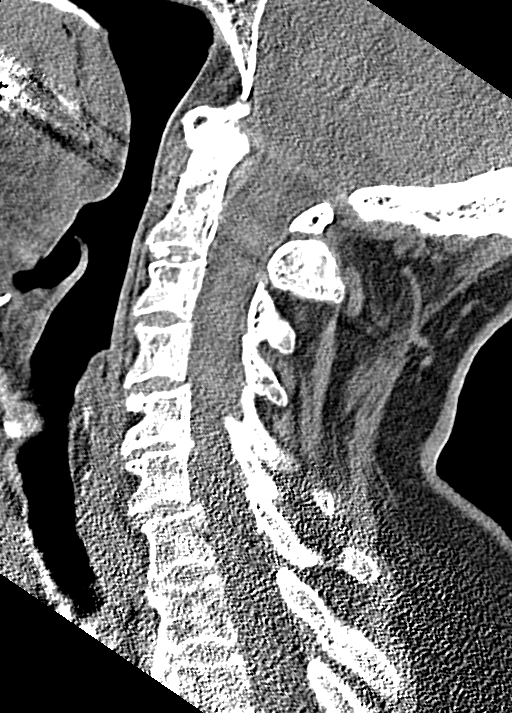
[im 26/51  bone]
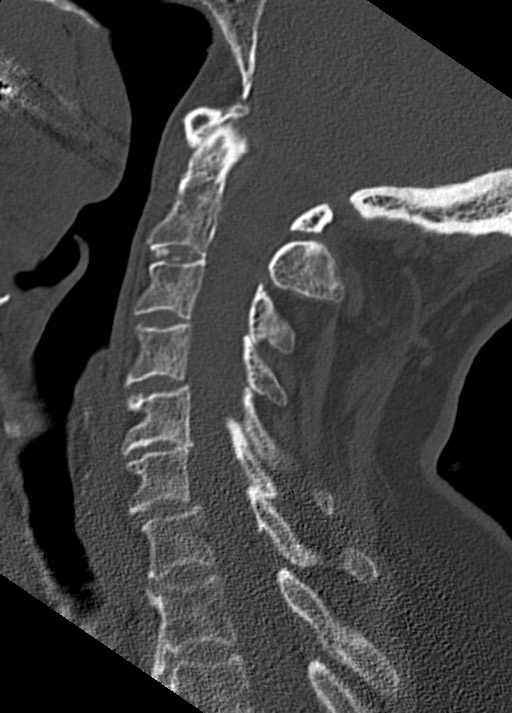
[im 30/51  bone]
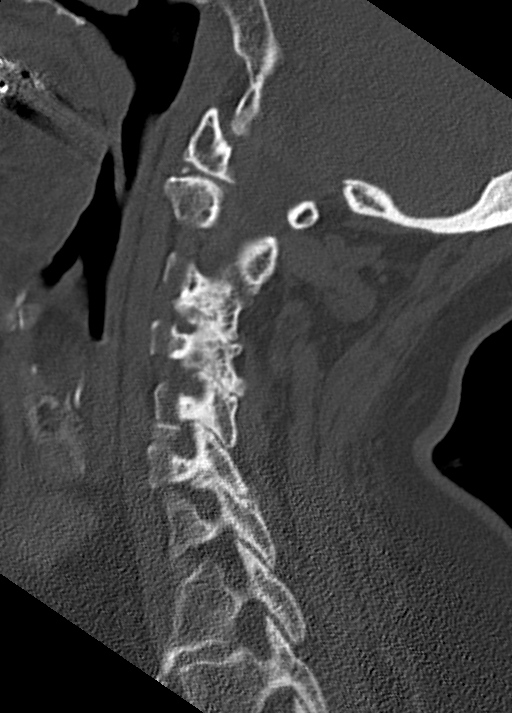
[im 34/51  bone]
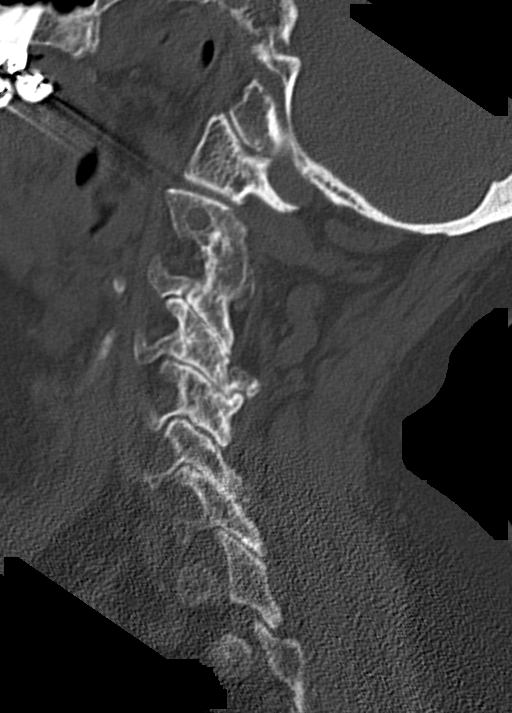

[Series 5: coronal bone · coronal · 0.23mm/px · 3 of 50 slices shown]
[im 11/50  bone]
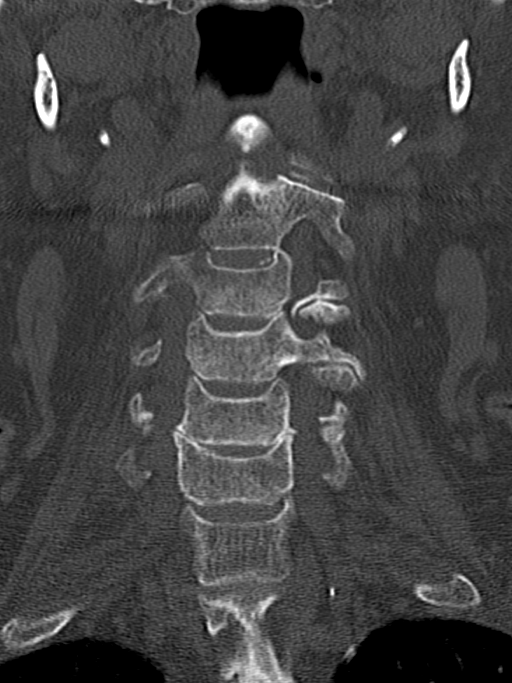
[im 20/50  bone]
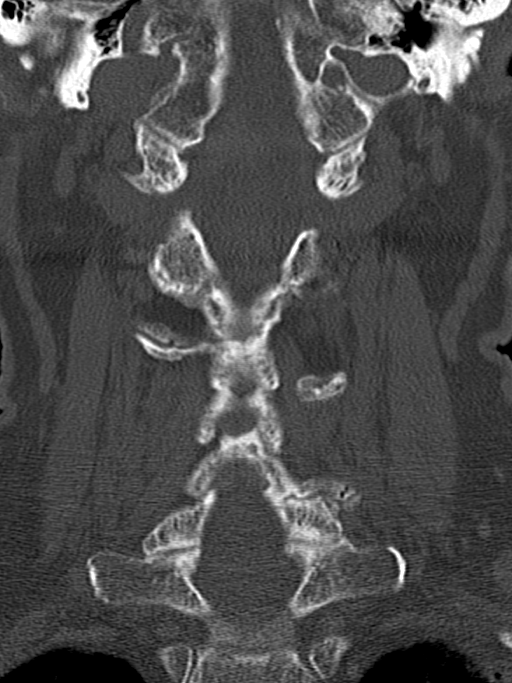
[im 30/50  bone]
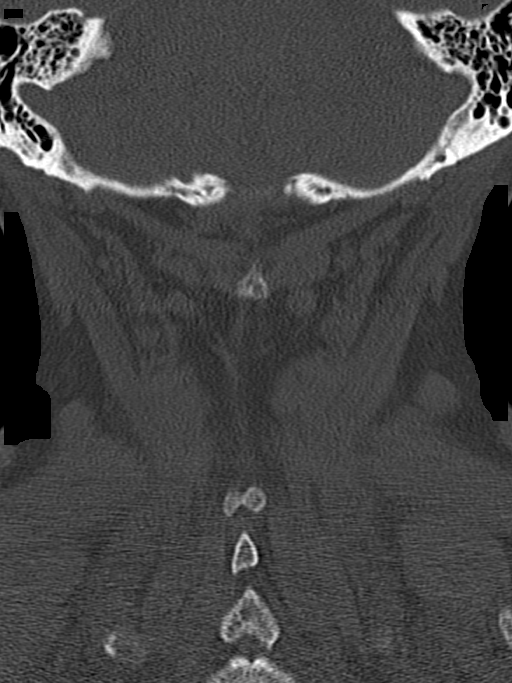

[Series 6: orthogonal bone · axial · 0.23mm/px · z∈[-326,-239]mm · 5 of 80 slices shown, 7 images]
[im 14/80  soft-tissue]
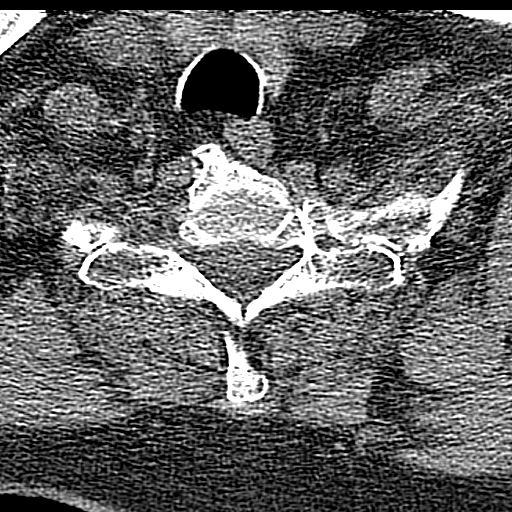
[im 14/80  bone]
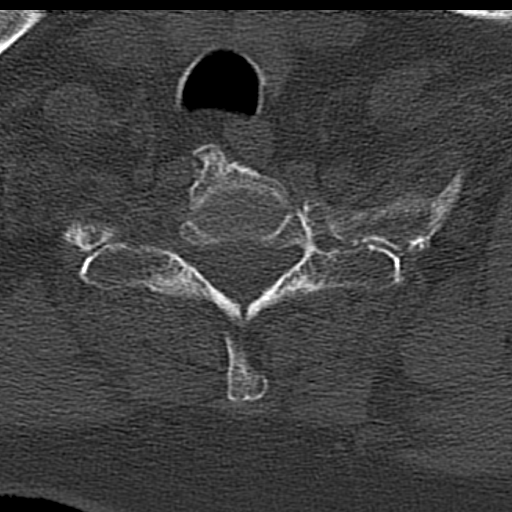
[im 27/80  bone]
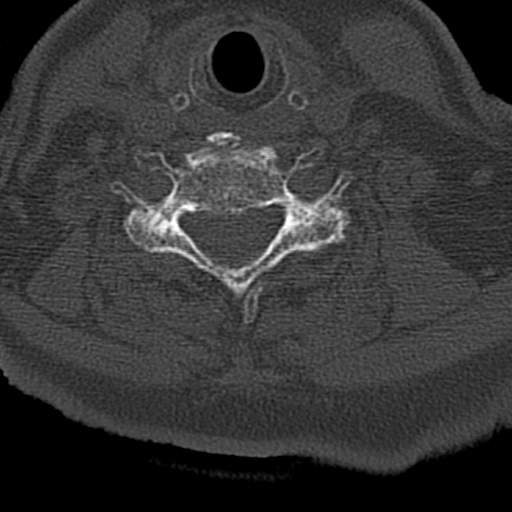
[im 40/80  bone]
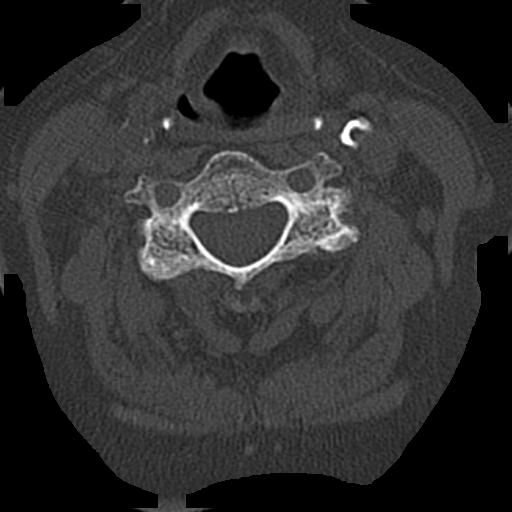
[im 53/80  bone]
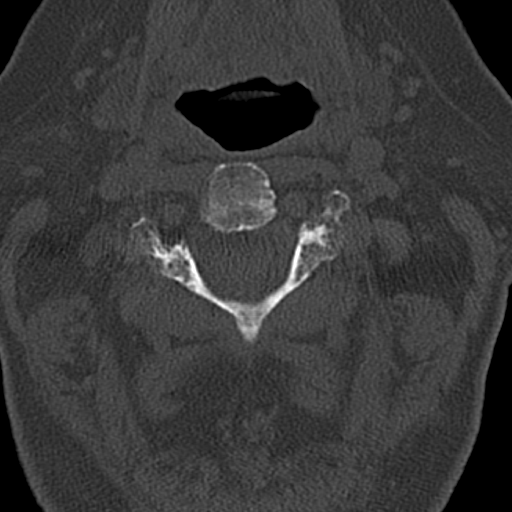
[im 66/80  soft-tissue]
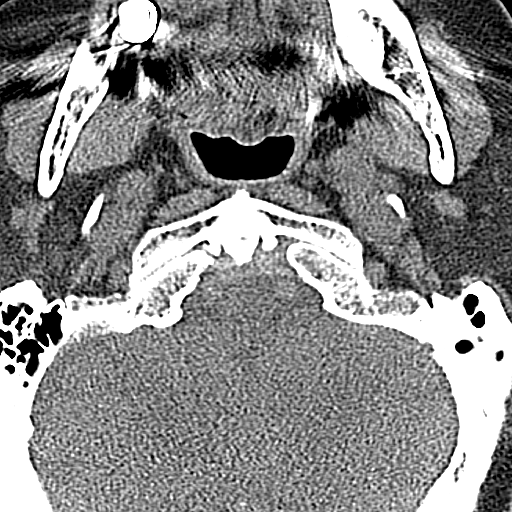
[im 66/80  bone]
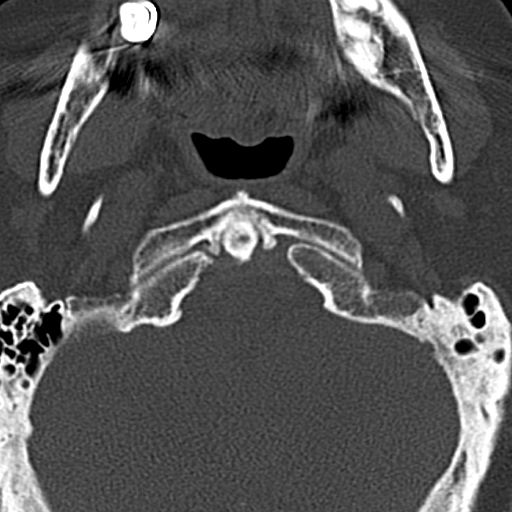

[13 of 33 positions shown; findings below may reference images not displayed]

FINDINGS: Alignment: Alignment is anatomic.

Skull base and vertebrae: No acute displaced cervical spine
fractures.

Soft tissues and spinal canal: No prevertebral fluid or swelling. No
visible canal hematoma.

Disc levels: Multilevel spondylosis and facet hypertrophy greatest
from C2 through C5.

Upper chest: Displaced left posterior third rib fracture is
partially visualized. There is adjacent soft tissue swelling. Please
refer to left shoulder x-ray report.

Other: Reconstructed images demonstrate no additional findings.
IMPRESSION: 1. No acute cervical spine fracture.
2. Posterior left third rib fracture with associated chest wall soft
tissue swelling. Please refer to left shoulder x-ray report.

## 2021-11-11 ENCOUNTER — Other Ambulatory Visit: Payer: Self-pay

## 2021-11-11 ENCOUNTER — Emergency Department: Payer: Medicare Other

## 2021-11-11 ENCOUNTER — Emergency Department
Admission: EM | Admit: 2021-11-11 | Discharge: 2021-11-11 | Disposition: A | Discharge status: Home / Self Care | Payer: Medicare Other | Attending: Emergency Medicine | Admitting: Emergency Medicine

## 2021-11-11 DIAGNOSIS — M5431 Sciatica, right side: Secondary | ICD-10-CM

## 2021-11-11 DIAGNOSIS — Z1152 Encounter for screening for COVID-19: Secondary | ICD-10-CM | POA: Insufficient documentation

## 2021-11-11 DIAGNOSIS — M5441 Lumbago with sciatica, right side: Secondary | ICD-10-CM | POA: Insufficient documentation

## 2021-11-11 DIAGNOSIS — M545 Low back pain, unspecified: Secondary | ICD-10-CM | POA: Diagnosis present

## 2021-11-11 DIAGNOSIS — R1031 Right lower quadrant pain: Secondary | ICD-10-CM | POA: Insufficient documentation

## 2021-11-11 LAB — CBC WITH DIFFERENTIAL/PLATELET
Abs Immature Granulocytes: 0.05 10*3/uL (ref 0.00–0.07)
Basophils Absolute: 0 10*3/uL (ref 0.0–0.1)
Basophils Relative: 0 %
Eosinophils Absolute: 0.1 10*3/uL (ref 0.0–0.5)
Eosinophils Relative: 1 %
HCT: 36.7 % (ref 36.0–46.0)
Hemoglobin: 12.3 g/dL (ref 12.0–15.0)
Immature Granulocytes: 1 %
Lymphocytes Relative: 35 %
Lymphs Abs: 2.2 10*3/uL (ref 0.7–4.0)
MCH: 30 pg (ref 26.0–34.0)
MCHC: 33.5 g/dL (ref 30.0–36.0)
MCV: 89.5 fL (ref 80.0–100.0)
Monocytes Absolute: 0.5 10*3/uL (ref 0.1–1.0)
Monocytes Relative: 8 %
Neutro Abs: 3.5 10*3/uL (ref 1.7–7.7)
Neutrophils Relative %: 55 %
Platelets: 120 10*3/uL — ABNORMAL LOW (ref 150–400)
RBC: 4.1 MIL/uL (ref 3.87–5.11)
RDW: 13.1 % (ref 11.5–15.5)
WBC: 6.4 10*3/uL (ref 4.0–10.5)
nRBC: 0 % (ref 0.0–0.2)

## 2021-11-11 LAB — URINALYSIS, ROUTINE W REFLEX MICROSCOPIC
Bacteria, UA: NONE SEEN
Bilirubin Urine: NEGATIVE
Glucose, UA: >=500 mg/dL — AB
Hgb urine dipstick: NEGATIVE
Ketones, ur: NEGATIVE mg/dL
Leukocytes,Ua: NEGATIVE
Nitrite: NEGATIVE
Protein, ur: NEGATIVE mg/dL
Specific Gravity, Urine: 1.006 (ref 1.005–1.030)
Squamous Epithelial / HPF: NONE SEEN (ref 0–5)
pH: 5 (ref 5.0–8.0)

## 2021-11-11 LAB — COMPREHENSIVE METABOLIC PANEL
ALT: 14 U/L (ref 0–44)
AST: 29 U/L (ref 15–41)
Albumin: 4.2 g/dL (ref 3.5–5.0)
Alkaline Phosphatase: 49 U/L (ref 38–126)
Anion gap: 8 (ref 5–15)
BUN: 19 mg/dL (ref 8–23)
CO2: 20 mmol/L — ABNORMAL LOW (ref 22–32)
Calcium: 10.2 mg/dL (ref 8.9–10.3)
Chloride: 111 mmol/L (ref 98–111)
Creatinine, Ser: 1.26 mg/dL — ABNORMAL HIGH (ref 0.44–1.00)
GFR, Estimated: 41 mL/min — ABNORMAL LOW (ref >60)
Glucose, Bld: 125 mg/dL — ABNORMAL HIGH (ref 70–99)
Potassium: 4.8 mmol/L (ref 3.5–5.1)
Sodium: 139 mmol/L (ref 135–145)
Total Bilirubin: 0.8 mg/dL (ref 0.3–1.2)
Total Protein: 7.7 g/dL (ref 6.5–8.1)

## 2021-11-11 LAB — RESP PANEL BY RT-PCR (FLU A&B, COVID) ARPGX2
Influenza A by PCR: NEGATIVE
Influenza B by PCR: NEGATIVE
SARS Coronavirus 2 by RT PCR: NEGATIVE

## 2021-11-11 LAB — LIPASE, BLOOD: Lipase: 24 U/L (ref 11–51)

## 2021-11-11 LAB — TROPONIN I (HIGH SENSITIVITY): Troponin I (High Sensitivity): 4 ng/L (ref <18)

## 2021-11-11 MED ORDER — OXYCODONE HCL 5 MG PO TABS: 2.5000 mg | ORAL_TABLET | Freq: Three times a day (TID) | ORAL | 0 refills | Status: AC | PRN

## 2021-11-11 MED ORDER — ONDANSETRON 4 MG PO TBDP
4.0000 mg | ORAL_TABLET | Freq: Once | ORAL | Status: AC
  Administered 2021-11-11: 4 mg via ORAL
  Filled 2021-11-11: qty 1

## 2021-11-11 MED ORDER — OXYCODONE HCL 5 MG PO TABS
2.5000 mg | ORAL_TABLET | Freq: Once | ORAL | Status: AC
  Administered 2021-11-11: 2.5 mg via ORAL
  Filled 2021-11-11: qty 1

## 2021-11-11 MED ORDER — PREDNISONE 10 MG (21) PO TBPK: ORAL_TABLET | ORAL | 0 refills | Status: DC

## 2021-11-11 MED ORDER — IOHEXOL 300 MG/ML  SOLN
80.0000 mL | Freq: Once | INTRAMUSCULAR | Status: AC | PRN
  Administered 2021-11-11: 80 mL via INTRAVENOUS

## 2021-11-11 MED ORDER — LIDOCAINE 5 % EX PTCH
1.0000 | MEDICATED_PATCH | CUTANEOUS | Status: DC
  Administered 2021-11-11: 1 via TRANSDERMAL
  Filled 2021-11-11: qty 1

## 2021-11-11 MED ORDER — LIDOCAINE 5 % EX PTCH: 1.0000 | MEDICATED_PATCH | Freq: Two times a day (BID) | CUTANEOUS | 0 refills | Status: AC

## 2021-11-11 NOTE — ED Triage Notes (Signed)
Pt comes with c/o back pain that started last night. Pt states lower right sided. Pt denies any injuries.   VSS  Pt from [facility]

## 2021-11-11 NOTE — Discharge Instructions (Addendum)
Take Tylenol 1 g every 8 hours use the lidocaine patches and the steroid taper.  Use the oxycodone for only breakthrough pain as this can cause confusion, weakness and falls.  Your daughter is going to be with you the next 48 hours to try to help you through this but you should call your doctor who helps with your back pain to follow-up with them and return to the ER if you develop numbness, weakness, any other concerns.  Return to ER if develop abdominal discomfort diarrhea or discussed with the primary care doctor about getting some antibiotics for the below.  IMPRESSION: Diverticulosis is noted throughout the colon.   Moderate wall thickening of the right colon is noted concerning for infectious or inflammatory colitis.   Aortic Atherosclerosis (ICD10-I70.0).    Take oxycodone as prescribed. Do not drink alcohol, drive or participate in any other potentially dangerous activities while taking this medication as it may make you sleepy. Do not take this medication with any other sedating medications, either prescription or over-the-counter. If you were prescribed Percocet or Vicodin, do not take these with acetaminophen (Tylenol) as it is already contained within these medications.  This medication is an opiate (or narcotic) pain medication and can be habit forming. Use it as little as possible to achieve adequate pain control. Do not use or use it with extreme caution if you have a history of opiate abuse or dependence. If you are on a pain contract with your primary care doctor or a pain specialist, be sure to let them know you were prescribed this medication today from the Emergency Department. This medication is intended for your use only - do not give any to anyone else and keep it in a secure place where nobody else, especially children, have access to it.

## 2021-11-11 NOTE — ED Notes (Addendum)
See triage  note  Presents with lower back pain     Pain is mainly on the right side   Pain started last pm

## 2021-11-11 NOTE — ED Provider Notes (Signed)
[facility] Provider Note    Event Date/Time   First MD Initiated Contact with Patient 11/11/21 1105     (approximate)   History   Back Pain   HPI  Leah Contreras is a 88 y.o. female  comes from [facility] for Right sided back pain.  Patient reports having not felt well for the past few days.  She states she cannot really explain what she is feeling that it just was a uneasy feeling that she had.  She reports feeling slightly nauseous and does not even going down to eat with the rest of her facility.  She denies any falls hitting her head or chest pain or shortness of breath although does report occasional burping and acid reflux up into her chest.  While feeling this way she then started to develop some right low back pain that radiates into her hip.  She reports that this does not feel like her prior back pain that she is had previously.  She reports taking Tylenol this morning without relief in symptoms.  Patient denies any symptoms of cord compression, numbness, weakness in the legs, urinating or defecating or saddle anesthesia.  Patient is daughter is at bedside who reports being concerned about her having intermittent abdominal pain over the past few days  On review of records patient was seen on 09/14/2021 for her chronic back pain requiring injections previously in the epidural region from [facility].   Physical Exam   Triage Vital Signs: ED Triage Vitals  Enc Vitals Group     BP 11/11/21 1042 114/63     Pulse Rate 11/11/21 1042 73     Resp 11/11/21 1042 17     Temp 11/11/21 1042 98 F (36.7 C)     Temp Source 11/11/21 1042 Oral     SpO2 11/11/21 1042 94 %     Weight 11/11/21 1103 156 lb 15.5 oz (71.2 kg)     Height 11/11/21 1103 5\' 5"  (1.651 m)     Head Circumference --      Peak Flow --      Pain Score 11/11/21 1038 5     Pain Loc --      Pain Edu? --      Excl. in GC? --     Most recent vital signs: Vitals:   11/11/21 1042  BP: 114/63   Pulse: 73  Resp: 17  Temp: 98 F (36.7 C)  SpO2: 94%     General: Awake, no distress.  CV:  Good peripheral perfusion.  Resp:  Normal effort.  Abd:  No distention.  Other:  Equal strength in arms and legs.  Sensation intact.  Able to lift both legs up off the bed without any discomfort.  No pain with palpation or rash noted on the right flank area but did have a little bit of pain when she went to go sit back.  She got good distal pulses with no wounds on the feet.   ED Results / Procedures / Treatments   Labs (all labs ordered are listed, but only abnormal results are displayed) Labs Reviewed  RESP PANEL BY RT-PCR (FLU A&B, COVID) [id number]  CBC WITH DIFFERENTIAL/PLATELET  COMPREHENSIVE METABOLIC PANEL  LIPASE, BLOOD  URINALYSIS, ROUTINE W REFLEX MICROSCOPIC  TROPONIN I (HIGH SENSITIVITY)     EKG  My interpretation of EKG:  Normal sinus rate 63 without any ST elevation or T wave inversions, first-degree AV block  RADIOLOGY I have reviewed the  xray personally and interpreted no evidence of any pneumonia  PROCEDURES:  Critical Care performed: No  Procedures   MEDICATIONS ORDERED IN ED: Medications  lidocaine (LIDODERM) 5 % 1 patch (1 patch Transdermal Patch Applied 11/11/21 1241)  oxyCODONE (Oxy IR/ROXICODONE) immediate release tablet 2.5 mg (2.5 mg Oral Given 11/11/21 1240)  ondansetron (ZOFRAN-ODT) disintegrating tablet 4 mg (4 mg Oral Given 11/11/21 1240)  iohexol (OMNIPAQUE) 300 MG/ML solution 80 mL (80 mLs Intravenous Contrast Given 11/11/21 1428)     IMPRESSION / MDM / ASSESSMENT AND PLAN / ED COURSE  I reviewed the triage vital signs and the nursing notes.   Patient's presentation is most consistent with acute presentation with potential threat to life or bodily function.   Initial O2 charted as 94% but she denies any shortness of breath or chest pain and I rechecked it and it was 97%.  Patient comes in with some vague just not feeling well in an  88 year old with some right back pain.  Labs ordered to evaluate for Electra abnormalities, UTI, COVID, ACS.  Given the back pain I suspect this most likely sciatica but the daughter is reporting that she is had some intermittent abdominal pain as well over the past few days and we discussed CT imaging they would like to proceed.  Urine is without evidence of UTI.  CBC is reassuring with similarly low platelets.  Creatinine is a little bit above her baseline.  Lipase normal troponin was negative COVID-negative.  Her CT scan shows some moderate wall thickening of the right colon but when I push on this area she is got no abdominal tenderness I do not feel that this represents an acute colitis.  She is not having any diarrhea.  She denies any blood in her stool.  They will continue to monitor her symptoms and if develops any diarrhea will get followed up with her primary care doctor or return to the ER we had discussed antibiotics but of opted to hold off at this time.  Patient now reports that more of the pain is shooting down the back of the right leg and it seems to be more like a sciatica pain.  We discussed trialing some steroids, lidocaine patches, Tylenol and a few doses of oxycodone given that patient's daughter can be there to help ensure that does not cause any confusion or any falls.  She has good strength in her legs no numbness no tingling normal flexion and extension of the ankle and no signs of urinary retention, saddle anesthesia to suggest cord compression at this time.  She is got a doctor that she can follow-up with outpatient.  Patient is able to ambulate at this time he feels comfortable with the above plan     FINAL CLINICAL IMPRESSION(S) / ED DIAGNOSES   Final diagnoses:  Sciatica of right side     Rx / DC Orders   ED Discharge Orders     None        Note:  This document was prepared using Dragon voice recognition software and may include unintentional dictation  errors.   Concha Se, MD 11/11/21 [phone]

## 2021-11-23 NOTE — Progress Notes (Signed)
Feeling nausea and elderly if positive would need treatment

## 2021-12-16 ENCOUNTER — Ambulatory Visit: Payer: Medicare Other | Admitting: Dermatology

## 2022-01-17 ENCOUNTER — Ambulatory Visit: Payer: Medicare Other | Admitting: Dermatology

## 2022-08-21 ENCOUNTER — Other Ambulatory Visit: Payer: Self-pay

## 2022-08-21 DIAGNOSIS — D72829 Elevated white blood cell count, unspecified: Secondary | ICD-10-CM | POA: Diagnosis not present

## 2022-08-21 DIAGNOSIS — R1032 Left lower quadrant pain: Secondary | ICD-10-CM | POA: Diagnosis present

## 2022-08-21 DIAGNOSIS — K5732 Diverticulitis of large intestine without perforation or abscess without bleeding: Secondary | ICD-10-CM | POA: Insufficient documentation

## 2022-08-21 DIAGNOSIS — E860 Dehydration: Secondary | ICD-10-CM | POA: Insufficient documentation

## 2022-08-21 DIAGNOSIS — N179 Acute kidney failure, unspecified: Secondary | ICD-10-CM | POA: Insufficient documentation

## 2022-08-21 LAB — COMPREHENSIVE METABOLIC PANEL
ALT: 12 U/L (ref 0–44)
AST: 27 U/L (ref 15–41)
Albumin: 3.3 g/dL — ABNORMAL LOW (ref 3.5–5.0)
Alkaline Phosphatase: 74 U/L (ref 38–126)
Anion gap: 14 (ref 5–15)
BUN: 39 mg/dL — ABNORMAL HIGH (ref 8–23)
CO2: 14 mmol/L — ABNORMAL LOW (ref 22–32)
Calcium: 9.6 mg/dL (ref 8.9–10.3)
Chloride: 111 mmol/L (ref 98–111)
Creatinine, Ser: 1.96 mg/dL — ABNORMAL HIGH (ref 0.44–1.00)
GFR, Estimated: 24 mL/min — ABNORMAL LOW (ref >60)
Glucose, Bld: 158 mg/dL — ABNORMAL HIGH (ref 70–99)
Potassium: 4.5 mmol/L (ref 3.5–5.1)
Sodium: 139 mmol/L (ref 135–145)
Total Bilirubin: 1.4 mg/dL — ABNORMAL HIGH (ref 0.3–1.2)
Total Protein: 6.5 g/dL (ref 6.5–8.1)

## 2022-08-21 LAB — CBC
HCT: 38.1 % (ref 36.0–46.0)
Hemoglobin: 12.2 g/dL (ref 12.0–15.0)
MCH: 30.3 pg (ref 26.0–34.0)
MCHC: 32 g/dL (ref 30.0–36.0)
MCV: 94.8 fL (ref 80.0–100.0)
Platelets: 166 10*3/uL (ref 150–400)
RBC: 4.02 MIL/uL (ref 3.87–5.11)
RDW: 12.9 % (ref 11.5–15.5)
WBC: 21.9 10*3/uL — ABNORMAL HIGH (ref 4.0–10.5)
nRBC: 0 % (ref 0.0–0.2)

## 2022-08-21 LAB — LIPASE, BLOOD: Lipase: 35 U/L (ref 11–51)

## 2022-08-21 NOTE — ED Triage Notes (Signed)
Refer to First Nurse note.

## 2022-08-21 NOTE — ED Notes (Signed)
RN unable to get blood. RN called phlebotomy

## 2022-08-21 NOTE — ED Notes (Signed)
First nurse note:  EMS called out for N/V/D. Took pt off BP meds and increased lasix. Coming from [facility]. HX of dementia.   105/53 Afib 53 HR 94% RA  BGL 153 97.8T

## 2022-08-22 ENCOUNTER — Emergency Department: Payer: Medicare Other

## 2022-08-22 ENCOUNTER — Emergency Department
Admission: EM | Admit: 2022-08-22 | Discharge: 2022-08-22 | Disposition: A | Discharge status: Home / Self Care | Payer: Medicare Other | Attending: Emergency Medicine | Admitting: Emergency Medicine

## 2022-08-22 DIAGNOSIS — K5732 Diverticulitis of large intestine without perforation or abscess without bleeding: Secondary | ICD-10-CM | POA: Diagnosis not present

## 2022-08-22 DIAGNOSIS — N179 Acute kidney failure, unspecified: Secondary | ICD-10-CM

## 2022-08-22 DIAGNOSIS — K5792 Diverticulitis of intestine, part unspecified, without perforation or abscess without bleeding: Secondary | ICD-10-CM

## 2022-08-22 DIAGNOSIS — E860 Dehydration: Secondary | ICD-10-CM

## 2022-08-22 MED ORDER — LACTATED RINGERS IV BOLUS
1000.0000 mL | Freq: Once | INTRAVENOUS | Status: AC
  Administered 2022-08-22: 1000 mL via INTRAVENOUS

## 2022-08-22 MED ORDER — AMOXICILLIN-POT CLAVULANATE 500-125 MG PO TABS
1.0000 | ORAL_TABLET | Freq: Two times a day (BID) | ORAL | 0 refills | Status: AC
Start: 2022-08-22 — End: 2022-09-01

## 2022-08-22 MED ORDER — ONDANSETRON 4 MG PO TBDP
4.0000 mg | ORAL_TABLET | Freq: Three times a day (TID) | ORAL | 0 refills | Status: DC | PRN
Start: 2022-08-22 — End: 2022-12-08

## 2022-08-22 MED ORDER — AMOXICILLIN-POT CLAVULANATE 500-125 MG PO TABS
1.0000 | ORAL_TABLET | ORAL | Status: AC
  Administered 2022-08-22: 1 via ORAL
  Filled 2022-08-22: qty 1

## 2022-08-22 NOTE — ED Notes (Signed)
Attempted to call report to facility, no success. Patient family is transporting patient back to facility

## 2022-08-22 NOTE — ED Provider Notes (Signed)
[facility] Provider Note    Event Date/Time   First MD Initiated Contact with Patient 08/22/22 0031     (approximate)   History   Nausea   HPI Leah Contreras is a 89 y.o. female who presents for evaluation of nausea, vomiting, diarrhea, and lower abdominal pain primarily on the left.  Symptoms been going on a couple of days.  She says she has not vomited now for 24 hours but she has not had much to eat or drink.  Her daughter comments that it is very difficult to get her to drink much of anything at any time.  No fever.  No chest pain or shortness of breath.  Patient denies recent dysuria.     Physical Exam   Triage Vital Signs: ED Triage Vitals [08/21/22 1951]  Encounter Vitals Group     BP (!) 98/42     Systolic BP Percentile      Diastolic BP Percentile      Pulse Rate 64     Resp 18     Temp 98.7 F (37.1 C)     Temp Source Oral     SpO2 94 %     Weight 63.5 kg (140 lb)     Height 1.651 m (5\' 5" )     Head Circumference      Peak Flow      Pain Score 0     Pain Loc      Pain Education      Exclude from Growth Chart     Most recent vital signs: Vitals:   08/21/22 1951 08/21/22 2136  BP: (!) 98/42 (!) 105/46  Pulse: 64 65  Resp: 18 18  Temp: 98.7 F (37.1 C) 98.7 F (37.1 C)  SpO2: 94% 94%    General: Awake, no distress.  Elderly but generally well-appearing. CV:  Good peripheral perfusion.  Regular rate and rhythm. Resp:  Normal effort. Speaking easily and comfortably, no accessory muscle usage nor intercostal retractions.  Lungs clear to auscultation. Abd:  No distention.  Soft but tender to palpation in the left lower quadrant with some guarding.  No rebound.  Nonperitoneal abdomen.   ED Results / Procedures / Treatments   Labs (all labs ordered are listed, but only abnormal results are displayed) Labs Reviewed  COMPREHENSIVE METABOLIC PANEL - Abnormal; Notable for the following components:      Result Value   CO2 14 (*)     Glucose, Bld 158 (*)    BUN 39 (*)    Creatinine, Ser 1.96 (*)    Albumin 3.3 (*)    Total Bilirubin 1.4 (*)    GFR, Estimated 24 (*)    All other components within normal limits  CBC - Abnormal; Notable for the following components:   WBC 21.9 (*)    All other components within normal limits  LIPASE, BLOOD     RADIOLOGY I viewed and interpreted the patient's CT of the abdomen and pelvis.  She has some inflammatory changes consistent with diverticulitis.  Radiology report agrees.   PROCEDURES:  Critical Care performed: No  Procedures    IMPRESSION / MDM / ASSESSMENT AND PLAN / ED COURSE  I reviewed the triage vital signs and the nursing notes.                              Differential diagnosis includes, but is not limited to, dehydration, diverticulitis,  SBO/ileus, acute electrolyte or metabolic abnormality.  Patient's presentation is most consistent with acute presentation with potential threat to life or bodily function.  Labs/studies ordered: CMP, lipase, CBC, CT abdomen/pelvis  Interventions/Medications given:  Medications  amoxicillin-clavulanate (AUGMENTIN) 500-125 MG per tablet 1 tablet (has no administration in time range)  lactated ringers bolus 1,000 mL (1,000 mLs Intravenous New Bag/Given 08/22/22 0313)    (Note:  hospital course my include additional interventions and/or labs/studies not listed above.)   Labs are notable for a white blood cell count of about 22 and an acute kidney injury with a creatinine of 1.96.  I rehydrated initially with normal saline 500 mL liter IV bolus and then verified on her echocardiogram that she has a preserved ejection fraction.  I ordered another LR 1 L IV bolus.  CT scan consistent with diverticulitis which makes sense clinically.  Patient would prefer to go home and given that it is uncomplicated diverticulitis, I think that staying in the hospital may actually be worse for her than going back to home where she can  eat and drink.  I stressed to her and her daughter that she should drink plenty of fluids and that we are hydrating her up but she needs to drink the fluids in order to prevent dehydration and kidney problems.  They said that they understand.  I gave her an initial dose of Augmentin 500-125 mg given her decreased creatinine clearance and I will write her prescription for 500 mg every 12 hours x 10 days.       FINAL CLINICAL IMPRESSION(S) / ED DIAGNOSES   Final diagnoses:  Diverticulitis  Dehydration  Acute kidney injury (HCC)     Rx / DC Orders   ED Discharge Orders          Ordered    amoxicillin-clavulanate (AUGMENTIN) 500-125 MG tablet  Every 12 hours        08/22/22 0318    ondansetron (ZOFRAN-ODT) 4 MG disintegrating tablet  Every 8 hours PRN        08/22/22 0318             Note:  This document was prepared using Dragon voice recognition software and may include unintentional dictation errors.   Loleta Rose, MD 08/22/22 0330

## 2022-08-22 NOTE — Discharge Instructions (Addendum)
We believe your symptoms are caused by diverticulitis.  Most of the time this condition (please read through the included information) can be cured with outpatient antibiotics.  Please take the full course of prescribed medication(s) and follow up with the doctors recommended above.  It is also very important you drink plenty of fluids to stay hydrated.  You are a bit dehydrated tonight and the need to make sure to drink more water than usual.  Return to the ED if your abdominal pain worsens or fails to improve, you develop bloody vomiting, bloody diarrhea, you are unable to tolerate fluids due to vomiting, fever greater than 101, or other symptoms that concern you.

## 2022-11-04 DEATH — deceased
